# Patient Record
Sex: Female | Born: 1970 | Race: White | Hispanic: No | Marital: Married | Smoking: Current every day smoker
Health system: Southern US, Community
[De-identification: ages and names within clinical notes are randomized; demographics above are authoritative.]

## PROBLEM LIST (undated history)

## (undated) DIAGNOSIS — J45909 Unspecified asthma, uncomplicated: Secondary | ICD-10-CM

## (undated) DIAGNOSIS — J449 Chronic obstructive pulmonary disease, unspecified: Secondary | ICD-10-CM

## (undated) HISTORY — PX: COLONOSCOPY: SHX174

## (undated) HISTORY — PX: CHOLECYSTECTOMY: SHX55

## (undated) HISTORY — DX: Chronic obstructive pulmonary disease, unspecified: J44.9

## (undated) HISTORY — PX: TUBAL LIGATION: SHX77

---

## 2008-10-09 ENCOUNTER — Emergency Department (HOSPITAL_BASED_OUTPATIENT_CLINIC_OR_DEPARTMENT_OTHER): Admission: EM | Admit: 2008-10-09 | Discharge: 2008-10-09 | Payer: Self-pay | Admitting: Emergency Medicine

## 2008-10-09 ENCOUNTER — Encounter: Payer: Self-pay | Admitting: Family Medicine

## 2008-10-09 ENCOUNTER — Ambulatory Visit: Payer: Self-pay | Admitting: Diagnostic Radiology

## 2008-10-14 ENCOUNTER — Ambulatory Visit: Payer: Self-pay | Admitting: Family Medicine

## 2008-10-14 DIAGNOSIS — M79609 Pain in unspecified limb: Secondary | ICD-10-CM | POA: Insufficient documentation

## 2008-10-14 DIAGNOSIS — D759 Disease of blood and blood-forming organs, unspecified: Secondary | ICD-10-CM | POA: Insufficient documentation

## 2008-10-14 DIAGNOSIS — H9209 Otalgia, unspecified ear: Secondary | ICD-10-CM | POA: Insufficient documentation

## 2008-10-15 ENCOUNTER — Encounter: Payer: Self-pay | Admitting: Family Medicine

## 2008-10-15 LAB — CONVERTED CEMR LAB
BUN: 9 mg/dL (ref 6–23)
CO2: 25 meq/L (ref 19–32)
Creatinine, Ser: 0.85 mg/dL (ref 0.40–1.20)
Glucose, Bld: 96 mg/dL (ref 70–99)
TSH: 1.447 microintl units/mL (ref 0.350–4.50)
Total Bilirubin: 0.6 mg/dL (ref 0.3–1.2)
Vit D, 1,25-Dihydroxy: 24 — ABNORMAL LOW (ref 30–89)
Vitamin B-12: 505 pg/mL (ref 211–911)

## 2008-10-16 LAB — CONVERTED CEMR LAB: Iron: 130 ug/dL (ref 42–145)

## 2008-10-18 ENCOUNTER — Ambulatory Visit: Payer: Self-pay | Admitting: Family Medicine

## 2008-10-18 ENCOUNTER — Other Ambulatory Visit: Admission: RE | Admit: 2008-10-18 | Discharge: 2008-10-18 | Payer: Self-pay | Admitting: Family Medicine

## 2008-10-18 ENCOUNTER — Encounter: Payer: Self-pay | Admitting: Family Medicine

## 2008-10-18 DIAGNOSIS — K5901 Slow transit constipation: Secondary | ICD-10-CM | POA: Insufficient documentation

## 2008-10-18 DIAGNOSIS — K644 Residual hemorrhoidal skin tags: Secondary | ICD-10-CM | POA: Insufficient documentation

## 2008-10-21 LAB — CONVERTED CEMR LAB
Cholesterol: 174 mg/dL (ref 0–200)
Eosinophils Absolute: 0.2 10*3/uL (ref 0.0–0.7)
Eosinophils Relative: 2 % (ref 0–5)
HCT: 41.2 % (ref 36.0–46.0)
Lymphs Abs: 2.7 10*3/uL (ref 0.7–4.0)
MCV: 93.2 fL (ref 78.0–100.0)
Platelets: 556 10*3/uL — ABNORMAL HIGH (ref 150–400)
RDW: 13 % (ref 11.5–15.5)
VLDL: 12 mg/dL (ref 0–40)
WBC: 8.5 10*3/uL (ref 4.0–10.5)

## 2008-10-24 ENCOUNTER — Telehealth: Payer: Self-pay | Admitting: Family Medicine

## 2008-10-29 ENCOUNTER — Ambulatory Visit: Payer: Self-pay | Admitting: Oncology

## 2008-11-11 ENCOUNTER — Encounter: Payer: Self-pay | Admitting: Family Medicine

## 2008-11-11 ENCOUNTER — Telehealth (INDEPENDENT_AMBULATORY_CARE_PROVIDER_SITE_OTHER): Payer: Self-pay | Admitting: *Deleted

## 2008-11-11 LAB — CBC WITH DIFFERENTIAL/PLATELET
BASO%: 0.3 % (ref 0.0–2.0)
EOS%: 2.4 % (ref 0.0–7.0)
HCT: 41.7 % (ref 34.8–46.6)
LYMPH%: 25.8 % (ref 14.0–48.0)
MCH: 31.7 pg (ref 26.0–34.0)
MCHC: 33.6 g/dL (ref 32.0–36.0)
MONO#: 0.5 10*3/uL (ref 0.1–0.9)
NEUT%: 66.3 % (ref 39.6–76.8)
RBC: 4.42 10*6/uL (ref 3.70–5.32)
WBC: 9.4 10*3/uL (ref 3.9–10.0)
lymph#: 2.4 10*3/uL (ref 0.9–3.3)

## 2008-11-11 LAB — COMPREHENSIVE METABOLIC PANEL
Albumin: 4.3 g/dL (ref 3.5–5.2)
BUN: 7 mg/dL (ref 6–23)
CO2: 32 mEq/L (ref 19–32)
Glucose, Bld: 120 mg/dL — ABNORMAL HIGH (ref 70–99)
Potassium: 4 mEq/L (ref 3.5–5.3)
Sodium: 141 mEq/L (ref 135–145)
Total Protein: 7 g/dL (ref 6.0–8.3)

## 2008-11-11 LAB — MORPHOLOGY

## 2008-11-11 LAB — URIC ACID: Uric Acid, Serum: 4 mg/dL (ref 2.4–7.0)

## 2008-11-11 LAB — ERYTHROCYTE SEDIMENTATION RATE: Sed Rate: 6 mm/hr (ref 0–30)

## 2008-11-12 LAB — ANA: Anti Nuclear Antibody(ANA): NEGATIVE

## 2008-11-14 LAB — CK: Total CK: 73 U/L (ref 7–177)

## 2008-11-26 ENCOUNTER — Encounter: Payer: Self-pay | Admitting: Family Medicine

## 2008-12-09 ENCOUNTER — Encounter: Payer: Self-pay | Admitting: Family Medicine

## 2008-12-09 LAB — CBC WITH DIFFERENTIAL/PLATELET
Basophils Absolute: 0 10*3/uL (ref 0.0–0.1)
EOS%: 2.4 % (ref 0.0–7.0)
Eosinophils Absolute: 0.3 10*3/uL (ref 0.0–0.5)
HCT: 42.1 % (ref 34.8–46.6)
HGB: 14 g/dL (ref 11.6–15.9)
MCH: 31.5 pg (ref 25.1–34.0)
MONO#: 0.5 10*3/uL (ref 0.1–0.9)
NEUT#: 8 10*3/uL — ABNORMAL HIGH (ref 1.5–6.5)
NEUT%: 70.9 % (ref 38.4–76.8)
RDW: 13.4 % (ref 11.2–14.5)
lymph#: 2.5 10*3/uL (ref 0.9–3.3)

## 2008-12-09 LAB — COMPREHENSIVE METABOLIC PANEL
Albumin: 4.2 g/dL (ref 3.5–5.2)
Alkaline Phosphatase: 65 U/L (ref 39–117)
BUN: 12 mg/dL (ref 6–23)
CO2: 27 mEq/L (ref 19–32)
Calcium: 9.1 mg/dL (ref 8.4–10.5)
Chloride: 105 mEq/L (ref 96–112)
Glucose, Bld: 94 mg/dL (ref 70–99)
Potassium: 4.2 mEq/L (ref 3.5–5.3)
Sodium: 139 mEq/L (ref 135–145)
Total Protein: 7 g/dL (ref 6.0–8.3)

## 2008-12-11 LAB — JAK2 GENOTYPR: JAK2 GenotypR: NOT DETECTED

## 2009-02-13 ENCOUNTER — Ambulatory Visit: Payer: Self-pay | Admitting: Oncology

## 2009-02-17 ENCOUNTER — Encounter: Payer: Self-pay | Admitting: Family Medicine

## 2009-02-17 LAB — CBC WITH DIFFERENTIAL/PLATELET
BASO%: 0.5 % (ref 0.0–2.0)
Eosinophils Absolute: 0.4 10*3/uL (ref 0.0–0.5)
HCT: 39 % (ref 34.8–46.6)
LYMPH%: 31.8 % (ref 14.0–49.7)
MONO#: 0.4 10*3/uL (ref 0.1–0.9)
NEUT#: 4.8 10*3/uL (ref 1.5–6.5)
Platelets: 425 10*3/uL — ABNORMAL HIGH (ref 145–400)
RBC: 4.16 10*6/uL (ref 3.70–5.45)
WBC: 8.4 10*3/uL (ref 3.9–10.3)
lymph#: 2.7 10*3/uL (ref 0.9–3.3)

## 2009-02-17 LAB — ERYTHROCYTE SEDIMENTATION RATE: Sed Rate: 7 mm/hr (ref 0–30)

## 2009-04-15 ENCOUNTER — Ambulatory Visit: Payer: Self-pay | Admitting: Oncology

## 2009-06-24 ENCOUNTER — Ambulatory Visit: Payer: Self-pay | Admitting: Oncology

## 2009-09-24 ENCOUNTER — Emergency Department (HOSPITAL_COMMUNITY): Admission: EM | Admit: 2009-09-24 | Discharge: 2009-09-25 | Payer: Self-pay | Admitting: Emergency Medicine

## 2011-01-12 LAB — DIFFERENTIAL
Basophils Absolute: 0.2 10*3/uL — ABNORMAL HIGH (ref 0.0–0.1)
Lymphocytes Relative: 25 % (ref 12–46)
Monocytes Absolute: 0.7 10*3/uL (ref 0.1–1.0)
Monocytes Relative: 7 % (ref 3–12)
Neutro Abs: 6.5 10*3/uL (ref 1.7–7.7)
Neutrophils Relative %: 64 % (ref 43–77)

## 2011-01-12 LAB — URINALYSIS, ROUTINE W REFLEX MICROSCOPIC
Hgb urine dipstick: NEGATIVE
Nitrite: NEGATIVE
Specific Gravity, Urine: 1.023 (ref 1.005–1.030)
Urobilinogen, UA: 1 mg/dL (ref 0.0–1.0)

## 2011-01-12 LAB — CBC
HCT: 39.7 % (ref 36.0–46.0)
MCHC: 34.1 g/dL (ref 30.0–36.0)
MCV: 95.3 fL (ref 78.0–100.0)
Platelets: 497 10*3/uL — ABNORMAL HIGH (ref 150–400)
RDW: 13.1 % (ref 11.5–15.5)

## 2011-01-12 LAB — URINE CULTURE

## 2011-01-12 LAB — COMPREHENSIVE METABOLIC PANEL
Albumin: 3.9 g/dL (ref 3.5–5.2)
BUN: 9 mg/dL (ref 6–23)
Creatinine, Ser: 0.63 mg/dL (ref 0.4–1.2)
Total Protein: 6.6 g/dL (ref 6.0–8.3)

## 2011-01-12 LAB — PREGNANCY, URINE: Preg Test, Ur: NEGATIVE

## 2011-07-16 LAB — D-DIMER, QUANTITATIVE: D-Dimer, Quant: <0.22 ug/mL-FEU (ref 0.00–0.48)

## 2011-07-16 LAB — DIFFERENTIAL
Basophils Absolute: 0 10*3/uL (ref 0.0–0.1)
Basophils Relative: 0 % (ref 0–1)
Lymphocytes Relative: 30 % (ref 12–46)
Monocytes Absolute: 0.4 10*3/uL (ref 0.1–1.0)
Neutro Abs: 5.8 10*3/uL (ref 1.7–7.7)
Neutrophils Relative %: 62 % (ref 43–77)

## 2011-07-16 LAB — CBC
Hemoglobin: 13.9 g/dL (ref 12.0–15.0)
Platelets: 536 10*3/uL — ABNORMAL HIGH (ref 150–400)
RDW: 12.5 % (ref 11.5–15.5)

## 2011-07-16 LAB — BASIC METABOLIC PANEL
Calcium: 9.5 mg/dL (ref 8.4–10.5)
Creatinine, Ser: 0.8 mg/dL (ref 0.4–1.2)
GFR calc non Af Amer: >60 mL/min (ref >60)
Glucose, Bld: 105 mg/dL — ABNORMAL HIGH (ref 70–99)
Sodium: 141 mEq/L (ref 135–145)

## 2014-10-26 ENCOUNTER — Encounter: Payer: Self-pay | Admitting: Emergency Medicine

## 2014-10-26 ENCOUNTER — Emergency Department
Admission: EM | Admit: 2014-10-26 | Discharge: 2014-10-26 | Disposition: A | Discharge status: Home / Self Care | Payer: BLUE CROSS/BLUE SHIELD | Source: Home / Self Care | Attending: Family Medicine | Admitting: Family Medicine

## 2014-10-26 DIAGNOSIS — R112 Nausea with vomiting, unspecified: Secondary | ICD-10-CM

## 2014-10-26 DIAGNOSIS — R51 Headache: Secondary | ICD-10-CM

## 2014-10-26 DIAGNOSIS — R5382 Chronic fatigue, unspecified: Secondary | ICD-10-CM

## 2014-10-26 DIAGNOSIS — L989 Disorder of the skin and subcutaneous tissue, unspecified: Secondary | ICD-10-CM

## 2014-10-26 DIAGNOSIS — R519 Headache, unspecified: Secondary | ICD-10-CM

## 2014-10-26 DIAGNOSIS — R1013 Epigastric pain: Secondary | ICD-10-CM

## 2014-10-26 HISTORY — DX: Unspecified asthma, uncomplicated: J45.909

## 2014-10-26 LAB — POCT CBC W AUTO DIFF (K'VILLE URGENT CARE)

## 2014-10-26 LAB — POCT URINALYSIS DIP (MANUAL ENTRY)
BILIRUBIN UA: NEGATIVE
Glucose, UA: NEGATIVE
Ketones, POC UA: NEGATIVE
Nitrite, UA: NEGATIVE
PROTEIN UA: NEGATIVE
SPEC GRAV UA: 1.015 (ref 1.005–1.03)
UROBILINOGEN UA: 0.2 (ref 0–1)
pH, UA: 7 (ref 5–8)

## 2014-10-26 LAB — CBC
HCT: 42.4 % (ref 36.0–46.0)
HEMOGLOBIN: 14 g/dL (ref 12.0–15.0)
MCH: 31.7 pg (ref 26.0–34.0)
MCHC: 33 g/dL (ref 30.0–36.0)
MCV: 95.9 fL (ref 78.0–100.0)
MPV: 8.7 fL (ref 8.6–12.4)
PLATELETS: 558 10*3/uL — AB (ref 150–400)
RBC: 4.42 MIL/uL (ref 3.87–5.11)
RDW: 12.9 % (ref 11.5–15.5)
WBC: 8.5 10*3/uL (ref 4.0–10.5)

## 2014-10-26 LAB — COMPLETE METABOLIC PANEL WITH GFR
ALBUMIN: 4.3 g/dL (ref 3.5–5.2)
ALK PHOS: 71 U/L (ref 39–117)
ALT: 12 U/L (ref 0–35)
AST: 14 U/L (ref 0–37)
BILIRUBIN TOTAL: 0.5 mg/dL (ref 0.2–1.2)
BUN: 10 mg/dL (ref 6–23)
CHLORIDE: 103 meq/L (ref 96–112)
CO2: 26 meq/L (ref 19–32)
Calcium: 9.7 mg/dL (ref 8.4–10.5)
Creat: 0.77 mg/dL (ref 0.50–1.10)
GFR, Est African American: >89 mL/min
GFR, Est Non African American: >89 mL/min
GLUCOSE: 95 mg/dL (ref 70–99)
POTASSIUM: 4.6 meq/L (ref 3.5–5.3)
Sodium: 138 mEq/L (ref 135–145)
Total Protein: 7 g/dL (ref 6.0–8.3)

## 2014-10-26 LAB — POCT URINALYSIS DIPSTICK
Bilirubin, UA: NEGATIVE
Glucose, UA: NEGATIVE
KETONES UA: NEGATIVE
Nitrite, UA: NEGATIVE
PH UA: 7 (ref 5–8)
PROTEIN UA: NEGATIVE
Spec Grav, UA: 1.015 (ref 1.005–1.03)
Urobilinogen, UA: 0.2 (ref 0–1)

## 2014-10-26 LAB — SEDIMENTATION RATE: SED RATE: 9 mm/h (ref 0–22)

## 2014-10-26 LAB — C-REACTIVE PROTEIN

## 2014-10-26 LAB — CK: CK TOTAL: 96 U/L (ref 7–177)

## 2014-10-26 LAB — TSH: TSH: 1.087 u[IU]/mL (ref 0.350–4.500)

## 2014-10-26 MED ORDER — KETOROLAC TROMETHAMINE 60 MG/2ML IM SOLN
60.0000 mg | Freq: Once | INTRAMUSCULAR | Status: AC
  Administered 2014-10-26: 60 mg via INTRAMUSCULAR

## 2014-10-26 MED ORDER — ONDANSETRON HCL 4 MG/2ML IJ SOLN
4.0000 mg | Freq: Once | INTRAMUSCULAR | Status: AC
  Administered 2014-10-26: 4 mg via INTRAMUSCULAR

## 2014-10-26 MED ORDER — ONDANSETRON 4 MG PO TBDP: 4.0000 mg | ORAL_TABLET | Freq: Three times a day (TID) | ORAL | Status: DC | PRN

## 2014-10-26 NOTE — ED Notes (Signed)
Reports numerous symptoms over past months and has new patient appt.with PCP/[facility]. States 5 days ago began having nausea and vomiting, weakness and cramping of muscles.

## 2014-10-26 NOTE — ED Provider Notes (Signed)
CSN: [id number]     Arrival date & time 10/26/14  1219 History   First MD Initiated Contact with Patient 10/26/14 1253     Chief Complaint  Patient presents with  . Fatigue     HPI Comments: Patient presents with several complaints: 1)  She complains of five month history of recurring "sores" on her scalp that resolve and leave a "scab."  At present she has a small typical lesion on her right temporal area. 2)  She complains of persistent fatigue.  About 5 days ago she developed nausea/vomiting, weakness, and muscle cramps.  She has had "hot flashes" but no fever.  She complains of abdominal pain and occasional constipation but no recent changes in bowel movements.  She reports recent weight loss (147 pounds to 141 pounds) 3)  She complains of several year history of occasionally recurring brief "contractures" of her right hand for which she has undergone neurologic evaluation, including negative CT scan head one year ago, but no definite diagnosis.  About one month ago she began developing similar brief contractures in her right foot.  In the past (two years ago) she had a slightly elevated Rheumatoid Factor.   Past history of cervical cancer age 28.  Last pap smear 6 months ago negative.   The history is provided by the patient and the spouse.    Past Medical History  Diagnosis Date  . Asthma    Past Surgical History  Procedure Laterality Date  . Cholecystectomy    . Tubal ligation Bilateral    Family History  Problem Relation Age of Onset  . Diabetes Mother   . Stroke Mother   . Hypertension Mother   . Diabetes Sister   . Hypertension Sister   . Diabetes Brother   . Hypertension Brother    History  Substance Use Topics  . Smoking status: Current Every Day Smoker  . Smokeless tobacco: Not on file  . Alcohol Use: No   OB History    No data available     Review of Systems  Constitutional: Positive for activity change, fatigue and unexpected weight change. Negative for  fever, chills and diaphoresis.  HENT: Positive for ear pain and voice change. Negative for mouth sores, rhinorrhea, sinus pressure and trouble swallowing.        Recurring scalp lesions  Eyes: Negative.   Respiratory: Positive for shortness of breath. Negative for cough and wheezing.   Cardiovascular: Negative.   Gastrointestinal: Positive for nausea, vomiting, abdominal pain and constipation. Negative for diarrhea and blood in stool.  Endocrine: Negative for polydipsia and polyphagia.  Genitourinary: Negative.   Musculoskeletal: Positive for myalgias and arthralgias.  Skin: Negative for rash.  Neurological: Positive for headaches.       Occasional right hand and foot "contractures."    Allergies  Review of patient's allergies indicates no known allergies.  Home Medications   Prior to Admission medications   Medication Sig Start Date End Date Taking? Authorizing Provider  albuterol (PROVENTIL HFA;VENTOLIN HFA) 108 (90 BASE) MCG/ACT inhaler Inhale into the lungs every 6 (six) hours as needed for wheezing or shortness of breath.   Yes Historical Provider, MD  ondansetron (ZOFRAN ODT) 4 MG disintegrating tablet Take 1 tablet (4 mg total) by mouth every 8 (eight) hours as needed for nausea or vomiting. Dissolve under tongue 10/26/14   Kandra Nicolas, MD   BP 138/80 mmHg  Pulse 95  Temp(Src) 97.6 F (36.4 C) (Oral)  Resp 16  Ht 5'  2" (1.575 m)  Wt 140 lb (63.504 kg)  BMI 25.60 kg/m2  SpO2 100% Physical Exam  Constitutional: She is oriented to person, place, and time. She appears well-developed and well-nourished. No distress.  HENT:  Head: Normocephalic.    Right Ear: Tympanic membrane and external ear normal.  Left Ear: Tympanic membrane and external ear normal.  Nose: Nose normal.  Mouth/Throat: Oropharynx is clear and moist.  Right temporal area has an approximately 10mm diameter benign appearing slightly raised lesion with brownish pigmentation as noted on diagram.      Eyes: Conjunctivae and EOM are normal. Pupils are equal, round, and reactive to light.  Neck: Neck supple. No thyromegaly present.  Note tenderness to palpation over thyroid.  Cardiovascular: Normal heart sounds.   Pulmonary/Chest: Breath sounds normal.  Abdominal: She exhibits no mass. There is tenderness. There is guarding. There is no rebound.  There is tenderness over epigastric and peri-umbilical area.  Musculoskeletal: She exhibits no edema.  Lymphadenopathy:    She has no cervical adenopathy.  Neurological: She is alert and oriented to person, place, and time. She has normal reflexes. No cranial nerve deficit.  Skin: Skin is warm and dry.  Nursing note and vitals reviewed.   ED Course  Procedures  None     Labs Reviewed  POCT CBC W AUTO DIFF ([facility]):  WBC 8.9; LY 40.5; MO 5.6; GR 53.9; Hgb 14.0; Platelets 498   COMPLETE METABOLIC PANEL WITH GFR  SEDIMENTATION RATE  TSH  CK  ANA  C-REACTIVE PROTEIN  CBC  POCT URINALYSIS DIP (MANUAL ENTRY):  BLO moderate; LEU small; otherwise negative            MDM   1. Nausea and vomiting, vomiting of unspecified type   2. Chronic fatigue ?etiology; note thrombocytosis   3. Epigastric pain   4. Scalp lesion   5. Headache, unspecified headache type    Screening lab tests:  CBC to lab; CMP; Sed rate; C-reactive protein; TSH; CK; ANA  Toradol 60mg  IM for headache.  Zofran 4mg  IM for nausea.  Rx for Zofran ODT 4mg . Followup with Family Doctor as scheduled in 3 days.  Needs further evaluation of thrombocytosis. Recommend biopsy of scalp lesion    Kandra Nicolas, MD 10/28/14 [phone]

## 2014-10-26 NOTE — Discharge Instructions (Signed)
May take Ibuprofen 200mg , 4 tabs every 8 hours with food for headache. Begin clear liquids for 12 hours, then gradually advance diet.

## 2014-10-28 ENCOUNTER — Encounter: Payer: Self-pay | Admitting: Physician Assistant

## 2014-10-28 ENCOUNTER — Ambulatory Visit (INDEPENDENT_AMBULATORY_CARE_PROVIDER_SITE_OTHER): Payer: BLUE CROSS/BLUE SHIELD | Admitting: Physician Assistant

## 2014-10-28 VITALS — BP 152/84 | HR 75 | Temp 98.2°F | Ht 61.0 in | Wt 139.0 lb

## 2014-10-28 DIAGNOSIS — H539 Unspecified visual disturbance: Secondary | ICD-10-CM

## 2014-10-28 DIAGNOSIS — R1011 Right upper quadrant pain: Secondary | ICD-10-CM

## 2014-10-28 DIAGNOSIS — R531 Weakness: Secondary | ICD-10-CM

## 2014-10-28 DIAGNOSIS — R319 Hematuria, unspecified: Secondary | ICD-10-CM

## 2014-10-28 DIAGNOSIS — R634 Abnormal weight loss: Secondary | ICD-10-CM | POA: Diagnosis not present

## 2014-10-28 DIAGNOSIS — L989 Disorder of the skin and subcutaneous tissue, unspecified: Secondary | ICD-10-CM

## 2014-10-28 DIAGNOSIS — R232 Flushing: Secondary | ICD-10-CM

## 2014-10-28 DIAGNOSIS — R252 Cramp and spasm: Secondary | ICD-10-CM

## 2014-10-28 DIAGNOSIS — L659 Nonscarring hair loss, unspecified: Secondary | ICD-10-CM

## 2014-10-28 DIAGNOSIS — N951 Menopausal and female climacteric states: Secondary | ICD-10-CM

## 2014-10-28 DIAGNOSIS — F411 Generalized anxiety disorder: Secondary | ICD-10-CM

## 2014-10-28 DIAGNOSIS — R1031 Right lower quadrant pain: Secondary | ICD-10-CM

## 2014-10-28 LAB — POCT URINALYSIS DIPSTICK
BILIRUBIN UA: NEGATIVE
GLUCOSE UA: NEGATIVE
NITRITE UA: NEGATIVE
PROTEIN UA: NEGATIVE
SPEC GRAV UA: 1.02
Urobilinogen, UA: 1
pH, UA: 5.5

## 2014-10-28 LAB — ANA: Anti Nuclear Antibody(ANA): NEGATIVE

## 2014-10-28 MED ORDER — VENLAFAXINE HCL ER 37.5 MG PO CP24: 37.5000 mg | ORAL_CAPSULE | Freq: Every day | ORAL | Status: DC

## 2014-10-28 MED ORDER — PANTOPRAZOLE SODIUM 40 MG PO TBEC: 40.0000 mg | DELAYED_RELEASE_TABLET | Freq: Every day | ORAL | Status: DC

## 2014-10-28 MED ORDER — CYCLOBENZAPRINE HCL 10 MG PO TABS: 10.0000 mg | ORAL_TABLET | Freq: Three times a day (TID) | ORAL | Status: DC | PRN

## 2014-10-29 LAB — VITAMIN B12: VITAMIN B 12: 457 pg/mL (ref 211–911)

## 2014-10-29 LAB — RHEUMATOID FACTOR: Rhuematoid fact SerPl-aCnc: <10 IU/mL (ref <=14)

## 2014-10-29 LAB — VITAMIN D 25 HYDROXY (VIT D DEFICIENCY, FRACTURES): VIT D 25 HYDROXY: 16 ng/mL — AB (ref 30–100)

## 2014-10-30 ENCOUNTER — Telehealth: Payer: Self-pay | Admitting: *Deleted

## 2014-10-30 DIAGNOSIS — R1011 Right upper quadrant pain: Secondary | ICD-10-CM | POA: Insufficient documentation

## 2014-10-30 DIAGNOSIS — R252 Cramp and spasm: Secondary | ICD-10-CM | POA: Insufficient documentation

## 2014-10-30 DIAGNOSIS — R634 Abnormal weight loss: Secondary | ICD-10-CM | POA: Insufficient documentation

## 2014-10-30 DIAGNOSIS — R319 Hematuria, unspecified: Secondary | ICD-10-CM | POA: Insufficient documentation

## 2014-10-30 DIAGNOSIS — R1031 Right lower quadrant pain: Secondary | ICD-10-CM | POA: Insufficient documentation

## 2014-10-30 DIAGNOSIS — R232 Flushing: Secondary | ICD-10-CM | POA: Insufficient documentation

## 2014-10-30 DIAGNOSIS — F411 Generalized anxiety disorder: Secondary | ICD-10-CM | POA: Insufficient documentation

## 2014-10-30 DIAGNOSIS — R531 Weakness: Secondary | ICD-10-CM | POA: Insufficient documentation

## 2014-10-30 DIAGNOSIS — L989 Disorder of the skin and subcutaneous tissue, unspecified: Secondary | ICD-10-CM | POA: Insufficient documentation

## 2014-10-30 DIAGNOSIS — L659 Nonscarring hair loss, unspecified: Secondary | ICD-10-CM | POA: Insufficient documentation

## 2014-10-30 DIAGNOSIS — H539 Unspecified visual disturbance: Secondary | ICD-10-CM | POA: Insufficient documentation

## 2014-10-30 LAB — METHYLMALONIC ACID, SERUM: Methylmalonic Acid, Quant: 145 nmol/L (ref 87–318)

## 2014-10-30 LAB — CYCLIC CITRUL PEPTIDE ANTIBODY, IGG: Cyclic Citrullin Peptide Ab: <2 U/mL (ref 0.0–5.0)

## 2014-10-30 LAB — URINE CULTURE
COLONY COUNT: NO GROWTH
Organism ID, Bacteria: NO GROWTH

## 2014-10-30 NOTE — Telephone Encounter (Signed)
CT approval rec'd from [facility] - [id number] expires 11/28/14. Radiology called.

## 2014-10-30 NOTE — Progress Notes (Addendum)
Subjective:    Patient ID: Danielle Marsh, female    DOB: 1971-04-21, 44 y.o.   MRN: [id number]  HPI Pt is a 44 yo female who presents to the clinic to establish care.   .. Active Ambulatory Problems    Diagnosis Date Noted  . THROMBOCYTOSIS 10/14/2008  . EAR PAIN, RIGHT 10/14/2008  . EXTERNAL HEMORRHOIDS 10/18/2008  . CONSTIPATION 10/18/2008  . LEG PAIN, BILATERAL 10/14/2008  . Weakness 10/30/2014  . Loss of weight 10/30/2014  . Hair loss 10/30/2014  . Vision changes 10/30/2014  . Skin lesion of scalp 10/30/2014  . Hot flashes 10/30/2014  . Hematuria 10/30/2014  . RUQ pain 10/30/2014  . RLQ abdominal pain 10/30/2014   Resolved Ambulatory Problems    Diagnosis Date Noted  . No Resolved Ambulatory Problems   Past Medical History  Diagnosis Date  . Asthma    .Marland Kitchen Family History  Problem Relation Age of Onset  . Diabetes Mother   . Stroke Mother   . Hypertension Mother   . Diabetes Sister   . Hypertension Sister   . Diabetes Brother   . Hypertension Brother    .Marland Kitchen History   Social History  . Marital Status: Married    Spouse Name: N/A    Number of Children: N/A  . Years of Education: N/A   Occupational History  . Not on file.   Social History Main Topics  . Smoking status: Current Every Day Smoker  . Smokeless tobacco: Not on file  . Alcohol Use: No  . Drug Use: No  . Sexual Activity: Not on file   Other Topics Concern  . Not on file   Social History Narrative   Patient today has multiple concerns. She admits that front she does not like healthcare providers. She often does not like the procedures to reach a diagnosis. She's been seen by neurologist in the past that suspected MS but she would not go through with the spinal tap. MRI never showed any lesions that could bring a diagnosis of MS. She comes in today with a lot of lower extremity weakness. She has used her chronic pain but she is not used to not feel like she can move her legs. She has seen  Dr. Maxie Better, an orthopedist and he felt like her symptoms worsen efficacy of her MS but Dr. Berdine Addison did not come up with a diagnosis. She has had some unintentional weight loss from 147 11/11/1937 in the past month. She has had 6 months of cyclical sores appearing in her scalp that are tender. She denies any drainage or vesicles. They resolve on their own and then come back. She's having a lot of muscle cramps and contractures. There is no rhyme or reason to these symptoms. They can occur at any time. She's become much more nauseated over the past week or so. She's developed some abdominal pain generalized. She denies any blood in her stool. She denies any food making her abdominal pain worse. She has had some vision blurriness which she plans on making an eye doctor appointment. She's had some hair loss.  Patient was seen in urgent care over the weekend and blood work was done. We will also get this blood work. No improvement over the weekend.     Review of Systems  All other systems reviewed and are negative.      Objective:   Physical Exam  Constitutional: She is oriented to person, place, and time.  HENT:  Head: Normocephalic  and atraumatic.    Right Ear: External ear normal.  Left Ear: External ear normal.  Eyes: Conjunctivae are normal.  Neck: Normal range of motion. Neck supple. No thyromegaly present.  Cardiovascular: Normal rate and normal heart sounds.   Pulmonary/Chest: Effort normal and breath sounds normal.  Abdominal: Soft. Bowel sounds are normal.  With palpation over her right upper quadrant and right lower quadrant there was intense pain. Patient started crying and screaming when I touch these areas to even light palpation. No rebound  Musculoskeletal:  No extremity weakness noted on exam today 5 out of 5.  Lymphadenopathy:    She has no cervical adenopathy.  Neurological: She is alert and oriented to person, place, and time.  Skin: Skin is dry.  Psychiatric: She has a  normal mood and affect. Her behavior is normal.          Assessment & Plan:  Weakness/loss of hair/loss of weight/vision changes/hot flashes/muscle cramps-unsure of what to make of these symptoms. Certainly think she needs an evaluation by rheumatologist/neurologist We'll start with some baseline blood work in the office. An move forward from there. For her muscle cramps I did give some Flexeril at bedtime. Discussed sedation warning. For patient's hot flashes and potential increased anxiety started Effexor. Perhaps this would also help with some of her ongoing pain. Follow-up in 4-6 weeks.  Right upper quadrant pain/nausea-patient was very tender to palpation on physical exam today. I did start Protonix 40 mg daily. Also will order a CT scan of the abdomen to evaluate. Would like to also test blood for H. pylori today. If pain worsening or not improving or develops blood in stool please follow-up.   Hematuria- will culture. No symptoms. Urine dipstick positive for blood and leuks. Will follow up as needed.

## 2014-10-31 ENCOUNTER — Ambulatory Visit (INDEPENDENT_AMBULATORY_CARE_PROVIDER_SITE_OTHER): Payer: BLUE CROSS/BLUE SHIELD

## 2014-10-31 DIAGNOSIS — R319 Hematuria, unspecified: Secondary | ICD-10-CM

## 2014-10-31 DIAGNOSIS — H539 Unspecified visual disturbance: Secondary | ICD-10-CM

## 2014-10-31 DIAGNOSIS — L989 Disorder of the skin and subcutaneous tissue, unspecified: Secondary | ICD-10-CM

## 2014-10-31 DIAGNOSIS — R232 Flushing: Secondary | ICD-10-CM

## 2014-10-31 DIAGNOSIS — L659 Nonscarring hair loss, unspecified: Secondary | ICD-10-CM

## 2014-10-31 DIAGNOSIS — Z9049 Acquired absence of other specified parts of digestive tract: Secondary | ICD-10-CM

## 2014-10-31 DIAGNOSIS — R1031 Right lower quadrant pain: Secondary | ICD-10-CM

## 2014-10-31 DIAGNOSIS — R531 Weakness: Secondary | ICD-10-CM

## 2014-10-31 DIAGNOSIS — R1011 Right upper quadrant pain: Secondary | ICD-10-CM

## 2014-10-31 DIAGNOSIS — R634 Abnormal weight loss: Secondary | ICD-10-CM

## 2014-10-31 LAB — H. PYLORI ANTIBODY, IGG: H Pylori IgG: 0.41 {ISR}

## 2014-10-31 MED ORDER — IOHEXOL 300 MG/ML  SOLN
100.0000 mL | Freq: Once | INTRAMUSCULAR | Status: AC | PRN
  Administered 2014-10-31: 100 mL via INTRAVENOUS

## 2016-11-24 DIAGNOSIS — N951 Menopausal and female climacteric states: Secondary | ICD-10-CM | POA: Diagnosis not present

## 2016-11-24 DIAGNOSIS — Z0000 Encounter for general adult medical examination without abnormal findings: Secondary | ICD-10-CM | POA: Diagnosis not present

## 2016-11-24 DIAGNOSIS — Z6829 Body mass index (BMI) 29.0-29.9, adult: Secondary | ICD-10-CM | POA: Diagnosis not present

## 2016-11-24 DIAGNOSIS — Z01411 Encounter for gynecological examination (general) (routine) with abnormal findings: Secondary | ICD-10-CM | POA: Diagnosis not present

## 2016-11-24 DIAGNOSIS — R319 Hematuria, unspecified: Secondary | ICD-10-CM | POA: Diagnosis not present

## 2016-11-24 DIAGNOSIS — Z124 Encounter for screening for malignant neoplasm of cervix: Secondary | ICD-10-CM | POA: Diagnosis not present

## 2016-11-24 DIAGNOSIS — R42 Dizziness and giddiness: Secondary | ICD-10-CM | POA: Diagnosis not present

## 2016-11-24 DIAGNOSIS — N950 Postmenopausal bleeding: Secondary | ICD-10-CM | POA: Diagnosis not present

## 2016-11-24 DIAGNOSIS — R6882 Decreased libido: Secondary | ICD-10-CM | POA: Diagnosis not present

## 2016-12-08 DIAGNOSIS — E559 Vitamin D deficiency, unspecified: Secondary | ICD-10-CM | POA: Diagnosis not present

## 2016-12-08 DIAGNOSIS — R899 Unspecified abnormal finding in specimens from other organs, systems and tissues: Secondary | ICD-10-CM | POA: Diagnosis not present

## 2016-12-08 DIAGNOSIS — N951 Menopausal and female climacteric states: Secondary | ICD-10-CM | POA: Diagnosis not present

## 2017-03-18 ENCOUNTER — Ambulatory Visit: Payer: Self-pay | Admitting: Family Medicine

## 2017-06-03 ENCOUNTER — Telehealth: Payer: Self-pay | Admitting: Family Medicine

## 2017-06-03 NOTE — Telephone Encounter (Signed)
Sure

## 2017-06-03 NOTE — Telephone Encounter (Signed)
Pt has not been seen by you since 2016 and was able to get an appointment with Evlyn Clines sooner,  is it okay with you that she becomes Charley's pt?

## 2017-06-08 ENCOUNTER — Ambulatory Visit: Payer: Self-pay | Admitting: Physician Assistant

## 2017-11-14 ENCOUNTER — Ambulatory Visit (INDEPENDENT_AMBULATORY_CARE_PROVIDER_SITE_OTHER): Payer: BLUE CROSS/BLUE SHIELD

## 2017-11-14 ENCOUNTER — Encounter: Payer: Self-pay | Admitting: Family Medicine

## 2017-11-14 ENCOUNTER — Ambulatory Visit: Payer: BLUE CROSS/BLUE SHIELD | Admitting: Family Medicine

## 2017-11-14 VITALS — BP 141/80 | HR 100 | Ht 61.0 in | Wt 156.0 lb

## 2017-11-14 DIAGNOSIS — W19XXXA Unspecified fall, initial encounter: Secondary | ICD-10-CM

## 2017-11-14 DIAGNOSIS — S199XXA Unspecified injury of neck, initial encounter: Secondary | ICD-10-CM | POA: Diagnosis not present

## 2017-11-14 DIAGNOSIS — S39012A Strain of muscle, fascia and tendon of lower back, initial encounter: Secondary | ICD-10-CM | POA: Diagnosis not present

## 2017-11-14 DIAGNOSIS — S161XXA Strain of muscle, fascia and tendon at neck level, initial encounter: Secondary | ICD-10-CM

## 2017-11-14 MED ORDER — CYCLOBENZAPRINE HCL 10 MG PO TABS
10.0000 mg | ORAL_TABLET | Freq: Every day | ORAL | 0 refills | Status: DC
Start: 2017-11-14 — End: 2018-12-27

## 2017-11-14 MED ORDER — NAPROXEN 500 MG PO TABS: 500.0000 mg | ORAL_TABLET | Freq: Two times a day (BID) | ORAL | 0 refills | Status: DC

## 2017-11-14 NOTE — Progress Notes (Signed)
Danielle Marsh is a 47 y.o. female who presents to [place] today for headache, back pain, and neck pain.  Patient strained back and hit head after climbing under desk at work on Friday. While climbing out, patient hit posterior aspect of head on under surface of desk. In addition, patient noticed significant neck and lower back pain after that point. She has continued to have a low grade headache that is responsive to ibuprofen. Denies dizziness, lightheadedness, balance issues, difficulty concentrating and nausea/vomiting. In terms of neck pain, patient feels like her neck is tight and achy. Has had shooting pain into right arm, but intermittent in nature and improving. Otherwise, patient complains of lower back pain, particularly on the right side. She states that sitting for prolonged periods of time and bending over make the pain worse. Heating pads and ibuprofen help the lower back pain somewhat. Patient experiencing intermittent sharp pain into mid thigh. Patients denies bowel or bladder incontinence and any sensory changes.    Past Medical History:  Diagnosis Date  . Asthma    Past Surgical History:  Procedure Laterality Date  . CHOLECYSTECTOMY    . TUBAL LIGATION Bilateral    Social History   Tobacco Use  . Smoking status: Current Every Day Smoker  . Smokeless tobacco: Never Used  Substance Use Topics  . Alcohol use: No     ROS:  As above   Medications: Current Outpatient Medications  Medication Sig Dispense Refill  . albuterol (PROVENTIL HFA;VENTOLIN HFA) 108 (90 BASE) MCG/ACT inhaler Inhale into the lungs every 6 (six) hours as needed for wheezing or shortness of breath.    . ondansetron (ZOFRAN ODT) 4 MG disintegrating tablet Take 1 tablet (4 mg total) by mouth every 8 (eight) hours as needed for nausea or vomiting. Dissolve under tongue 12 tablet 0  . pantoprazole (PROTONIX) 40 MG tablet Take 1 tablet (40 mg total) by  mouth daily. 30 tablet 2  . venlafaxine XR (EFFEXOR-XR) 37.5 MG 24 hr capsule Take 1 capsule (37.5 mg total) by mouth daily with breakfast. 30 capsule 1  . cyclobenzaprine (FLEXERIL) 10 MG tablet Take 1 tablet (10 mg total) by mouth at bedtime. 30 tablet 0  . naproxen (NAPROSYN) 500 MG tablet Take 1 tablet (500 mg total) by mouth 2 (two) times daily with a meal. 30 tablet 0   No current facility-administered medications for this visit.    No Known Allergies   Exam:  BP (!) 141/80   Pulse 100   Ht 5\' 1"  (1.549 m)   Wt 156 lb (70.8 kg)   BMI 29.48 kg/m  General: Well Developed, well nourished, and in no acute distress.  Neuro/Psych: Alert and oriented x3, extra-ocular muscles intact, able to move all 4 extremities, sensation grossly intact. Skin: Warm and dry, no rashes noted.  Respiratory: Not using accessory muscles, speaking in full sentences, trachea midline.  Cardiovascular: Pulses palpable, no extremity edema. Abdomen: Does not appear distended. MSK:  Cervical: Normal in appearance. Paraspinal and right periscapular muscle tenderness. Decreased active ROM on flexion and lateral flexion to right.  Strength 5+ in bilateral upper extremities. Reflexes 2+ in bilateral upper extremities.  Lumbosacral: Normal in appearance. Paraspinal muscle tenderness. No tenderness to palpation over midline lumbar spine. Strength 5/5 in lower extremities. Slump test mildly positive bilaterally.    No results found for this or any previous visit (from the past 48 hour(s)). No results found.    Assessment and Plan:  47 y.o. female with headache, neck pain, and back pain since Friday.  Headache: patient without symptoms of true concussion. She should continue NSAIDs for headache. Having no balance issues, cognitive defecit, dizziness, or nausea/vomiting.   Neck pain: Likely cervical muscle spasm right > left. Patient with decreased active ROM secondary to pain and muscular spasm. Reflexes  and strength intact. Will order cervical xray to rule out vertebral fracture or pathology.  Lumbosacral strain: patient's signs and symptoms most consistent with lumbosacral strain. Fracture or disc herniation are very unlikely given presentation. Patient will benefit from Naproxen, Flexeril, TINS unit, heating unit, and formal physical therapy.  Patient should return to follow up in 4 weeks.   Orders Placed This Encounter  Procedures  . DG Cervical Spine Complete    Standing Status:   Future    Standing Expiration Date:   01/13/2019    Order Specific Question:   Reason for Exam (SYMPTOM  OR DIAGNOSIS REQUIRED)    Answer:   eval pain cpsine after hitting head a few days ago    Order Specific Question:   Is patient pregnant?    Answer:   No    Order Specific Question:   Preferred imaging location?    Answer:   [facility]    Order Specific Question:   Radiology Contrast Protocol - do NOT remove file path    Answer:   [email or web address]  . Ambulatory referral to Physical Therapy    Referral Priority:   Routine    Referral Type:   Physical Medicine    Referral Reason:   Specialty Services Required    Requested Specialty:   Physical Therapy   Meds ordered this encounter  Medications  . naproxen (NAPROSYN) 500 MG tablet    Sig: Take 1 tablet (500 mg total) by mouth 2 (two) times daily with a meal.    Dispense:  30 tablet    Refill:  0  . cyclobenzaprine (FLEXERIL) 10 MG tablet    Sig: Take 1 tablet (10 mg total) by mouth at bedtime.    Dispense:  30 tablet    Refill:  0    Discussed warning signs or symptoms. Please see discharge instructions. Patient expresses understanding.

## 2017-11-14 NOTE — Patient Instructions (Signed)
Thank you for coming in today. Come back or go to the emergency room if you notice new weakness new numbness problems walking or bowel or bladder problems. Attend PT.  Use muscle relaxer as needed at night.  Use naproxen twice daily.  Use a heating pad and TENS unit.  TENS UNIT: This is helpful for muscle pain and spasm.   Search and Purchase a TENS 7000 2nd edition at  www.tenspros.com or [email or web address] It should be less than $30.     TENS unit instructions: Do not shower or bathe with the unit on Turn the unit off before removing electrodes or batteries If the electrodes lose stickiness add a drop of water to the electrodes after they are disconnected from the unit and place on plastic sheet. If you continued to have difficulty, call the TENS unit company to purchase more electrodes. Do not apply lotion on the skin area prior to use. Make sure the skin is clean and dry as this will help prolong the life of the electrodes. After use, always check skin for unusual red areas, rash or other skin difficulties. If there are any skin problems, does not apply electrodes to the same area. Never remove the electrodes from the unit by pulling the wires. Do not use the TENS unit or electrodes other than as directed. Do not change electrode placement without consultating your therapist or physician. Keep 2 fingers with between each electrode. Wear time ratio is 2:1, on to off times.    For example on for 30 minutes off for 15 minutes and then on for 30 minutes off for 15 minutes      Lumbosacral Strain Lumbosacral strain is an injury that causes pain in the lower back (lumbosacral spine). This injury usually occurs from overstretching the muscles or ligaments along your spine. A strain can affect one or more muscles or cord-like tissues that connect bones to other bones (ligaments). What are the causes? This condition may be caused by:  A hard, direct hit (blow) to the back.  Excessive  stretching of the lower back muscles. This may result from: ? A fall. ? Lifting something heavy. ? Repetitive movements such as bending or crouching.  What increases the risk? The following factors may increase your risk of getting this condition:  Participating in sports or activities that involve: ? A sudden twist of the back. ? Pushing or pulling motions.  Being overweight or obese.  Having poor strength and flexibility, especially tight hamstrings or weak muscles in the back or abdomen.  Having too much of a curve in the lower back.  Having a pelvis that is tilted forward.  What are the signs or symptoms? The main symptom of this condition is pain in the lower back, at the site of the strain. Pain may extend (radiate) down one or both legs. How is this diagnosed? This condition is diagnosed based on:  Your symptoms.  Your medical history.  A physical exam. ? Your health care provider may push on certain areas of your back to determine the source of your pain. ? You may be asked to bend forward, backward, and side to side to assess the severity of your pain and your range of motion.  Imaging tests, such as: ? X-rays. ? MRI.  How is this treated? Treatment for this condition may include:  Putting heat and cold on the affected area.  Medicines to help relieve pain and relax your muscles (muscle relaxants).  NSAIDs to help reduce  swelling and discomfort.  When your symptoms improve, it is important to gradually return to your normal routine as soon as possible to reduce pain, avoid stiffness, and avoid loss of muscle strength. Generally, symptoms should improve within 6 weeks of treatment. However, recovery time varies. Follow these instructions at home: Managing pain, stiffness, and swelling   If directed, put ice on the injured area during the first 24 hours after your strain. ? Put ice in a plastic bag. ? Place a towel between your skin and the bag. ? Leave  the ice on for 20 minutes, 2-3 times a day.  If directed, put heat on the affected area as often as told by your health care provider. Use the heat source that your health care provider recommends, such as a moist heat pack or a heating pad. ? Place a towel between your skin and the heat source. ? Leave the heat on for 20-30 minutes. ? Remove the heat if your skin turns bright red. This is especially important if you are unable to feel pain, heat, or cold. You may have a greater risk of getting burned. Activity  Rest and return to your normal activities as told by your health care provider. Ask your health care provider what activities are safe for you.  Avoid activities that take a lot of energy for as long as told by your health care provider. General instructions  Take over-the-counter and prescription medicines only as told by your health care provider.  Donot drive or use heavy machinery while taking prescription pain medicine.  Do not use any products that contain nicotine or tobacco, such as cigarettes and e-cigarettes. If you need help quitting, ask your health care provider.  Keep all follow-up visits as told by your health care provider. This is important. How is this prevented?  Use correct form when playing sports and lifting heavy objects.  Use good posture when sitting and standing.  Maintain a healthy weight.  Sleep on a mattress with medium firmness to support your back.  Be safe and responsible while being active to avoid falls.  Do at least 150 minutes of moderate-intensity exercise each week, such as brisk walking or water aerobics. Try a form of exercise that takes stress off your back, such as swimming or stationary cycling.  Maintain physical fitness, including: ? Strength. ? Flexibility. ? Cardiovascular fitness. ? Endurance. Contact a health care provider if:  Your back pain does not improve after 6 weeks of treatment.  Your symptoms get worse. Get  help right away if:  Your back pain is severe.  You cannot stand or walk.  You have difficulty controlling when you urinate or when you have a bowel movement.  You feel nauseous or you vomit.  Your feet get very cold.  You have numbness, tingling, weakness, or problems using your arms or legs.  You develop any of the following: ? Shortness of breath. ? Dizziness. ? Pain in your legs. ? Weakness in your buttocks or legs. ? Discoloration of the skin on your toes or legs. This information is not intended to replace advice given to you by your health care provider. Make sure you discuss any questions you have with your health care provider. Document Released: 07/07/2005 Document Revised: 04/16/2016 Document Reviewed: 02/29/2016 Elsevier Interactive Patient Education  2018 [employer].    Cervical Sprain A cervical sprain is a stretch or tear in one or more of the tough, cord-like tissues that connect bones (ligaments) in the  neck. Cervical sprains can range from mild to severe. Severe cervical sprains can cause the spinal bones (vertebrae) in the neck to be unstable. This can lead to spinal cord damage and can result in serious nervous system problems. The amount of time that it takes for a cervical sprain to get better depends on the cause and extent of the injury. Most cervical sprains heal in 4-6 weeks. What are the causes? Cervical sprains may be caused by an injury (trauma), such as from a motor vehicle accident, a fall, or sudden forward and backward whipping movement of the head and neck (whiplash injury). Mild cervical sprains may be caused by wear and tear over time, such as from poor posture, sitting in a chair that does not provide support, or looking up or down for long periods of time. What increases the risk? The following factors may make you more likely to develop this condition:  Participating in activities that have a high risk of trauma to the neck. These include  contact sports, auto racing, gymnastics, and diving.  Taking risks when driving or riding in a motor vehicle, such as speeding.  Having osteoarthritis of the spine.  Having poor strength and flexibility of the neck.  A previous neck injury.  Having poor posture.  Spending a lot of time in certain positions that put stress on the neck, such as sitting at a computer for long periods of time.  What are the signs or symptoms? Symptoms of this condition include:  Pain, soreness, stiffness, tenderness, swelling, or a burning sensation in the front, back, or sides of the neck.  Sudden tightening of neck muscles that you cannot control (muscle spasms).  Pain in the shoulders or upper back.  Limited ability to move the neck.  Headache.  Dizziness.  Nausea.  Vomiting.  Weakness, numbness, or tingling in a hand or an arm.  Symptoms may develop right away after injury, or they may develop over a few days. In some cases, symptoms may go away with treatment and return (recur) over time. How is this diagnosed? This condition may be diagnosed based on:  Your medical history.  Your symptoms.  Any recent injuries or known neck problems that you have, such as arthritis in the neck.  A physical exam.  Imaging tests, such as: ? X-rays. ? MRI. ? CT scan.  How is this treated? This condition is treated by resting and icing the injured area and doing physical therapy exercises. Depending on the severity of your condition, treatment may also include:  Keeping your neck in place (immobilized) for periods of time. This may be done using: ? A cervical collar. This supports your chin and the back of your head. ? A cervical traction device. This is a sling that holds up your head. This removes weight and pressure from your neck, and it may help to relieve pain.  Medicines that help to relieve pain and inflammation.  Medicines that help to relax your muscles (muscle  relaxants).  Surgery. This is rare.  Follow these instructions at home: If you have a cervical collar:  Wear it as told by your health care provider. Do not remove the collar unless instructed by your health care provider.  Ask your health care provider before you make any adjustments to your collar.  If you have long hair, keep it outside of the collar.  Ask your health care provider if you can remove the collar for cleaning and bathing. If you are allowed to  remove the collar for cleaning or bathing: ? Follow instructions from your health care provider about how to remove the collar safely. ? Clean the collar by wiping it with mild soap and water and drying it completely. ? If your collar has removable pads, remove them every 1-2 days and wash them by hand with soap and water. Let them air-dry completely before you put them back in the collar. ? Check your skin under the collar for irritation or sores. If you see any, tell your health care provider. Managing pain, stiffness, and swelling  If directed, use a cervical traction device as told by your health care provider.  If directed, apply heat to the affected area before you do your physical therapy or as often as told by your health care provider. Use the heat source that your health care provider recommends, such as a moist heat pack or a heating pad. ? Place a towel between your skin and the heat source. ? Leave the heat on for 20-30 minutes. ? Remove the heat if your skin turns bright red. This is especially important if you are unable to feel pain, heat, or cold. You may have a greater risk of getting burned.  If directed, put ice on the affected area: ? Put ice in a plastic bag. ? Place a towel between your skin and the bag. ? Leave the ice on for 20 minutes, 2-3 times a day. Activity  Do not drive while wearing a cervical collar. If you do not have a cervical collar, ask your health care provider if it is safe to drive while  your neck heals.  Do not drive or use heavy machinery while taking prescription pain medicine or muscle relaxants, unless your health care provider approves.  Do not lift anything that is heavier than 10 lb (4.5 kg) until your health care provider tells you that it is safe.  Rest as directed by your health care provider. Avoid positions and activities that make your symptoms worse. Ask your health care provider what activities are safe for you.  If physical therapy was prescribed, do exercises as told by your health care provider or physical therapist. General instructions  Take over-the-counter and prescription medicines only as told by your health care provider.  Do not use any products that contain nicotine or tobacco, such as cigarettes and e-cigarettes. These can delay healing. If you need help quitting, ask your health care provider.  Keep all follow-up visits as told by your health care provider or physical therapist. This is important. How is this prevented? To prevent a cervical sprain from happening again:  Use and maintain good posture. Make any needed adjustments to your workstation to help you use good posture.  Exercise regularly as directed by your health care provider or physical therapist.  Avoid risky activities that may cause a cervical sprain.  Contact a health care provider if:  You have symptoms that get worse or do not get better after 2 weeks of treatment.  You have pain that gets worse or does not get better with medicine.  You develop new, unexplained symptoms.  You have sores or irritated skin on your neck from wearing your cervical collar. Get help right away if:  You have severe pain.  You develop numbness, tingling, or weakness in any part of your body.  You cannot move a part of your body (you have paralysis).  You have neck pain along with: ? Severe dizziness. ? Headache. Summary  A  cervical sprain is a stretch or tear in one or more of  the tough, cord-like tissues that connect bones (ligaments) in the neck.  Cervical sprains may be caused by an injury (trauma), such as from a motor vehicle accident, a fall, or sudden forward and backward whipping movement of the head and neck (whiplash injury).  Symptoms may develop right away after injury, or they may develop over a few days.  This condition is treated by resting and icing the injured area and doing physical therapy exercises. This information is not intended to replace advice given to you by your health care provider. Make sure you discuss any questions you have with your health care provider. Document Released: 07/25/2007 Document Revised: 05/26/2016 Document Reviewed: 05/26/2016 Elsevier Interactive Patient Education  [employer].

## 2017-11-23 ENCOUNTER — Ambulatory Visit: Payer: BLUE CROSS/BLUE SHIELD | Admitting: Physical Therapy

## 2018-02-28 DIAGNOSIS — Z6830 Body mass index (BMI) 30.0-30.9, adult: Secondary | ICD-10-CM | POA: Diagnosis not present

## 2018-02-28 DIAGNOSIS — Z124 Encounter for screening for malignant neoplasm of cervix: Secondary | ICD-10-CM | POA: Diagnosis not present

## 2018-02-28 DIAGNOSIS — Z01419 Encounter for gynecological examination (general) (routine) without abnormal findings: Secondary | ICD-10-CM | POA: Diagnosis not present

## 2018-02-28 DIAGNOSIS — R5383 Other fatigue: Secondary | ICD-10-CM | POA: Diagnosis not present

## 2018-02-28 DIAGNOSIS — Z0000 Encounter for general adult medical examination without abnormal findings: Secondary | ICD-10-CM | POA: Diagnosis not present

## 2018-08-21 ENCOUNTER — Other Ambulatory Visit: Payer: Self-pay | Admitting: Physician Assistant

## 2018-08-21 ENCOUNTER — Encounter: Payer: Self-pay | Admitting: Physician Assistant

## 2018-08-21 ENCOUNTER — Ambulatory Visit: Payer: BLUE CROSS/BLUE SHIELD | Admitting: Physician Assistant

## 2018-08-21 VITALS — BP 149/83 | HR 82 | Ht 61.0 in | Wt 158.0 lb

## 2018-08-21 DIAGNOSIS — Q839 Congenital malformation of breast, unspecified: Secondary | ICD-10-CM | POA: Diagnosis not present

## 2018-08-21 DIAGNOSIS — Z23 Encounter for immunization: Secondary | ICD-10-CM

## 2018-08-21 DIAGNOSIS — N644 Mastodynia: Secondary | ICD-10-CM

## 2018-08-21 DIAGNOSIS — R922 Inconclusive mammogram: Secondary | ICD-10-CM

## 2018-08-21 DIAGNOSIS — K5901 Slow transit constipation: Secondary | ICD-10-CM

## 2018-08-21 DIAGNOSIS — F411 Generalized anxiety disorder: Secondary | ICD-10-CM

## 2018-08-21 MED ORDER — VENLAFAXINE HCL ER 37.5 MG PO CP24: 37.5000 mg | ORAL_CAPSULE | Freq: Every day | ORAL | 3 refills | Status: DC

## 2018-08-21 NOTE — Patient Instructions (Signed)
Get mammogram that will give [place] better information about what could be causing pain.   Will call and get labs and report back any suggestions.

## 2018-08-21 NOTE — Progress Notes (Signed)
Subjective:    Patient ID: Danielle Marsh, female    DOB: 22-Jan-1971, 47 y.o.   MRN: [id number]  HPI Pt is a 47 yo female who presents to the clinic with a lumpy feelings on left breast with pain for last 2 weeks. Her breast pain started more constant and not more intermittent with sharp pains occasionally. She noticed her left nipple felt more flat than it used to. She denies any warmth or swelling or breast. No fever, chills, body aches, weight loss or night sweats. She is having more episodes of random nausea. She has never had a mammogram. She has never done self breast exams.  Her grandmother had breast cancer. Her father recently passed away from bone cancer.   More recently had more problems with constipation. Not tried anything to make better. Stools are hard and straining to get them out.   She does request effexor to be refilled. No problems or concerns. She has been on for years. No SI/HC. She has noticed more anxiety over her breast.   She is a current smoker.   She did recently see [facility] for pap. Labs were done.   .. Active Ambulatory Problems    Diagnosis Date Noted  . THROMBOCYTOSIS 10/14/2008  . EAR PAIN, RIGHT 10/14/2008  . EXTERNAL HEMORRHOIDS 10/18/2008  . Slow transit constipation 10/18/2008  . LEG PAIN, BILATERAL 10/14/2008  . Weakness 10/30/2014  . Loss of weight 10/30/2014  . Hair loss 10/30/2014  . Vision changes 10/30/2014  . Skin lesion of scalp 10/30/2014  . Hot flashes 10/30/2014  . Hematuria 10/30/2014  . RUQ pain 10/30/2014  . RLQ abdominal pain 10/30/2014  . Muscle cramps 10/30/2014  . Generalized anxiety disorder 10/30/2014  . Dense breast tissue 08/21/2018  . 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 Breast pain, left 08/21/2018  . Elevated alkaline phosphatase level 08/22/2018  . Elevated platelet count 08/22/2018   Resolved Ambulatory Problems    Diagnosis Date Noted  . No Resolved Ambulatory Problems   Past Medical History:  Diagnosis Date  .  Asthma       .Marland Kitchen Family History  Problem Relation Age of Onset  . Diabetes Mother   . Stroke Mother   . Hypertension Mother   . Diabetes Sister   . Hypertension Sister   . Cancer Sister        bone cancer  . Diabetes Brother   . Hypertension Brother   . Bone cancer Father   . Cancer - Other Father   . Colon cancer Maternal Aunt   . Lung cancer Maternal Aunt   . Cancer Maternal Grandmother        breast    Review of Systems See HPI.     Objective:   Physical Exam  Constitutional: She is oriented to person, place, and time. She appears well-developed and well-nourished.  HENT:  Head: Normocephalic and atraumatic.  Cardiovascular: Normal rate and regular rhythm.  Pulmonary/Chest: Effort normal and breath sounds normal.    Neurological: She is alert and oriented to person, place, and time.  Psychiatric: She has a normal mood and affect. Her behavior is normal.          Assessment & Plan:  Marland KitchenMarland KitchenDiagnoses and all orders for this visit:  Breast pain, left -     MM DIAG BREAST TOMO BILATERAL  Nipple anomaly -     MM DIAG BREAST TOMO BILATERAL  Dense breast tissue -     MM DIAG BREAST TOMO BILATERAL  Need for Tdap vaccination -     Tdap vaccine greater than or equal to 7yo IM  Need for immunization against influenza -     Flu Vaccine QUAD 36+ mos IM  Generalized anxiety disorder -     venlafaxine XR (EFFEXOR-XR) 37.5 MG 24 hr capsule; Take 1 capsule (37.5 mg total) by mouth daily with breakfast.  Slow transit constipation   .Marland Kitchen GAD 7 : Generalized Anxiety Score 08/22/2018  Nervous, Anxious, on Edge 1  Control/stop worrying 1  Worry too much - different things 1  Trouble relaxing 1  Restless 1  Easily annoyed or irritable 1  Afraid - awful might happen 1  Total GAD 7 Score 7  Anxiety Difficulty Somewhat difficult    .Marland Kitchen Depression screen PHQ 2/9 08/22/2018  Decreased Interest 0  Down, Depressed, Hopeless 1  PHQ - 2 Score 1  Altered sleeping 0    Tired, decreased energy 0  Change in appetite 0  Feeling bad or failure about yourself  0  Trouble concentrating 0  Moving slowly or fidgety/restless 0  Suicidal thoughts 0  PHQ-9 Score 1  Difficult doing work/chores Not difficult at all     No signs of breast infection. Nipple erect today. Will get mammogram ordered ASAP.  Pap done at [facility]. Will call to get copy.   Refilled effexor.   Discussed probiotics and/or miralax.

## 2018-08-22 ENCOUNTER — Telehealth: Payer: Self-pay | Admitting: Physician Assistant

## 2018-08-22 DIAGNOSIS — R748 Abnormal levels of other serum enzymes: Secondary | ICD-10-CM

## 2018-08-22 DIAGNOSIS — R7989 Other specified abnormal findings of blood chemistry: Secondary | ICD-10-CM

## 2018-08-22 NOTE — Telephone Encounter (Signed)
Called and notified patient of results. Patient states she would like to have platelets checked in 1 month. No further questions or concerns at this time.

## 2018-08-22 NOTE — Telephone Encounter (Signed)
Please make pending order for CBC with platelets to have checked in 1 month.

## 2018-08-22 NOTE — Telephone Encounter (Signed)
Call pt: I did get a copy of labs.  LDL was elevated. HDL was great.  Kidney, glucose looks great.  Platelets elevated. Recheck in a 1 month.  One liver enzyme is elevated.  Pap is negative for any abnormal cells. Follow up in 3 years with next pap.

## 2018-08-29 ENCOUNTER — Ambulatory Visit: Payer: BLUE CROSS/BLUE SHIELD

## 2018-08-29 ENCOUNTER — Ambulatory Visit
Admission: RE | Admit: 2018-08-29 | Discharge: 2018-08-29 | Disposition: A | Discharge status: Home / Self Care | Payer: BLUE CROSS/BLUE SHIELD | Source: Ambulatory Visit | Attending: Physician Assistant | Admitting: Physician Assistant

## 2018-08-29 DIAGNOSIS — R928 Other abnormal and inconclusive findings on diagnostic imaging of breast: Secondary | ICD-10-CM | POA: Diagnosis not present

## 2018-08-29 NOTE — Progress Notes (Signed)
[facility]. Normal mammogram. Are you still having breast pain?

## 2018-08-30 NOTE — Progress Notes (Signed)
Call pt:   Lots of warm compresses and gentle massage. Consider some food changes to help with fibrocystic breast.   .Cut out (or cut down on) caffeine. This means coffee, tea, soda, energy drinks, and chocolate. .Lower your intake of added sugar. Adrian Prows your sodium intake. .Limit the amount of fat in your diet. .Increase fiber intake to 30 grams a day. .Eat more fruits, vegetables, and whole grains.

## 2018-09-25 NOTE — Telephone Encounter (Signed)
Spoke with Pt, those labs were done in Jan 2019. Placed order for recheck CBC and CMP. She will get those completed this week.

## 2018-12-27 ENCOUNTER — Encounter: Payer: Self-pay | Admitting: Family Medicine

## 2018-12-27 ENCOUNTER — Ambulatory Visit: Payer: BLUE CROSS/BLUE SHIELD | Admitting: Family Medicine

## 2018-12-27 ENCOUNTER — Other Ambulatory Visit: Payer: Self-pay

## 2018-12-27 VITALS — BP 138/75 | HR 85 | Temp 97.9°F | Wt 157.0 lb

## 2018-12-27 DIAGNOSIS — R05 Cough: Secondary | ICD-10-CM

## 2018-12-27 DIAGNOSIS — J4541 Moderate persistent asthma with (acute) exacerbation: Secondary | ICD-10-CM | POA: Diagnosis not present

## 2018-12-27 DIAGNOSIS — J410 Simple chronic bronchitis: Secondary | ICD-10-CM | POA: Diagnosis not present

## 2018-12-27 DIAGNOSIS — J449 Chronic obstructive pulmonary disease, unspecified: Secondary | ICD-10-CM

## 2018-12-27 DIAGNOSIS — J45909 Unspecified asthma, uncomplicated: Secondary | ICD-10-CM | POA: Insufficient documentation

## 2018-12-27 DIAGNOSIS — R059 Cough, unspecified: Secondary | ICD-10-CM

## 2018-12-27 HISTORY — DX: Chronic obstructive pulmonary disease, unspecified: J44.9

## 2018-12-27 MED ORDER — ALBUTEROL SULFATE HFA 108 (90 BASE) MCG/ACT IN AERS: 1.0000 | INHALATION_SPRAY | Freq: Four times a day (QID) | RESPIRATORY_TRACT | 1 refills | Status: DC | PRN

## 2018-12-27 MED ORDER — BENZONATATE 200 MG PO CAPS: 200.0000 mg | ORAL_CAPSULE | Freq: Three times a day (TID) | ORAL | 0 refills | Status: DC | PRN

## 2018-12-27 MED ORDER — FLUTICASONE-UMECLIDIN-VILANT 100-62.5-25 MCG/INH IN AEPB: 1.0000 | INHALATION_SPRAY | Freq: Every day | RESPIRATORY_TRACT | 12 refills | Status: DC

## 2018-12-27 NOTE — Progress Notes (Signed)
Danielle Marsh is a 48 y.o. female who presents to [facility] [place] [facility] today for cough.  Danielle Marsh has a history of asthma and a reported history of COPD in the past.  She notes over the last few weeks she is developed itchy watery eyes runny nose sneezing and congestion.  She attributes the symptoms due to seasonal allergies which she has had in the past.  She is developed a small cough recently.  She denies significant wheezing but does note some chest tightness and occasional shortness of breath she notes is typically consistent with asthma.  She used her albuterol inhaler which has helped however she is run out of it.  She notes a baseline sometimes she has difficulty with exertion and developed shortness of breath.  She has been attributing this to her COPD but does not have any controller medications to it.  She notes it is been years since that spirometry test showed COPD.  Her daughter is sick with a similar illness.  Both the patient and her daughter deny any fevers chills body aches or significant headache.   ROS as above:  Exam:  BP 138/75   Pulse 85   Temp 97.9 F (36.6 C) (Oral)   Wt 157 lb (71.2 kg)   BMI 29.66 kg/m  Wt Readings from Last 5 Encounters:  12/27/18 157 lb (71.2 kg)  08/21/18 158 lb (71.7 kg)  11/14/17 156 lb (70.8 kg)  10/28/14 139 lb (63 kg)  10/26/14 140 lb (63.5 kg)    Gen: Well NAD HEENT: EOMI,  MMM clear nasal discharge inflamed nasal turbinates bilaterally.  Posterior pharynx with cobblestoning. Lungs: Normal work of breathing. CTABL slight prolonged expiratory phase. Heart: RRR no MRG Abd: NABS, Soft. Nondistended, Nontender Exts: Brisk capillary refill, warm and well perfused.   Lab and Radiology Results No results found for this or any previous visit (from the past 72 hour(s)). No results found.    Assessment and Plan: 48 y.o.  female with cough likely due to seasonal allergies and asthma.  Plan to treat seasonal allergies with Zyrtec and Flonase nasal spray.  We will treat asthma exacerbation with albuterol inhaler.  Additionally will treat with Trelegy as below.  Will use Tessalon Perles for cough suppression along with over-the-counter medications.  Patient has moderately controlled asthma and a reported diagnosis of COPD.  She certainly has at baseline some exertional symptoms concerning for obstructive lung disease.  Given her history of exacerbations and her baseline difficulty with expiratory phase I think Trelegy is a good option.  Plan to start Trelegy and recheck at some point in the future.  Patient likely will benefit from spirometry testing.   No orders of the defined types were placed in this encounter.  Meds ordered this encounter  Medications  . Fluticasone-Umeclidin-Vilant (TRELEGY ELLIPTA) 100-62.5-25 MCG/INH AEPB    Sig: Inhale 1 puff into the lungs daily.    Dispense:  30 each    Refill:  12  . albuterol (PROVENTIL HFA;VENTOLIN HFA) 108 (90 Base) MCG/ACT inhaler    Sig: Inhale 1-2 puffs into the lungs every 6 (six) hours as needed for wheezing or shortness of breath.    Dispense:  1 Inhaler    Refill:  1  . benzonatate (TESSALON) 200 MG capsule    Sig: Take 1 capsule (200 mg total) by mouth 3 (three) times daily as needed for cough.    Dispense:  45 capsule  Refill:  0     Historical information moved to improve visibility of documentation.  Past Medical History:  Diagnosis Date  . Asthma   . COPD (chronic obstructive pulmonary disease) ([place]) 12/27/2018   Past Surgical History:  Procedure Laterality Date  . CHOLECYSTECTOMY    . TUBAL LIGATION Bilateral    Social History   Tobacco Use  . Smoking status: Current Every Day Smoker  . Smokeless tobacco: Never Used  Substance Use Topics  . Alcohol use: No   family history includes Bone cancer in her father; Breast cancer in her  maternal grandmother and paternal grandmother; Cancer in her maternal grandmother and sister; Cancer - Other in her father; Colon cancer in her maternal aunt; Diabetes in her brother, mother, and sister; Hypertension in her brother, mother, and sister; Lung cancer in her maternal aunt; Stroke in her mother.  Medications: Current Outpatient Medications  Medication Sig Dispense Refill  . albuterol (PROVENTIL HFA;VENTOLIN HFA) 108 (90 Base) MCG/ACT inhaler Inhale 1-2 puffs into the lungs every 6 (six) hours as needed for wheezing or shortness of breath. 1 Inhaler 1  . escitalopram (LEXAPRO) 20 MG tablet escitalopram 20 mg tablet  TAKE 1/2 (ONE-HALF) TABLET BY MOUTH ONCE DAILY MAY INCREASE TO WHOLE TABLET.    Marland Kitchen benzonatate (TESSALON) 200 MG capsule Take 1 capsule (200 mg total) by mouth 3 (three) times daily as needed for cough. 45 capsule 0  . Fluticasone-Umeclidin-Vilant (TRELEGY ELLIPTA) 100-62.5-25 MCG/INH AEPB Inhale 1 puff into the lungs daily. 30 each 12   No current facility-administered medications for this visit.    No Known Allergies   Discussed warning signs or symptoms. Please see discharge instructions. Patient expresses understanding.

## 2018-12-27 NOTE — Patient Instructions (Addendum)
Thank you for coming in today. Call or go to the emergency room if you get worse, have trouble breathing, have chest pains, or palpitations.  I think you have asthma/allergy.  Take over the counter zyrtec (certizine).  Use flonase over the counter.   Use albuterol as needed.  Take trelegy daily.  Use tessalon pearles as needed.   Call or go to the emergency room if you get worse, have trouble breathing, have chest pains, or palpitations.    Asthma, Adult  Asthma is a long-term (chronic) condition that causes recurrent episodes in which the airways become tight and narrow. The airways are the passages that lead from the nose and mouth down into the lungs. Asthma episodes, also called asthma attacks, can cause coughing, wheezing, shortness of breath, and chest pain. The airways can also fill with mucus. During an attack, it can be difficult to breathe. Asthma attacks can range from minor to life threatening. Asthma cannot be cured, but medicines and lifestyle changes can help control it and treat acute attacks. What are the causes? This condition is believed to be caused by inherited (genetic) and environmental factors, but its exact cause is not known. There are many things that can bring on an asthma attack or make asthma symptoms worse (triggers). Asthma triggers are different for each person. Common triggers include:  Mold.  Dust.  Cigarette smoke.  Cockroaches.  Things that can cause allergy symptoms (allergens), such as animal dander or pollen from trees or grass.  Air pollutants such as household cleaners, wood smoke, smog, or Ad[employer] account planner.  Cold air, weather changes, and winds (which increase molds and pollen in the air).  Strong emotional expressions such as crying or laughing hard.  Stress.  Certain medicines (such as aspirin) or types of medicines (such as beta-blockers).  Sulfites in foods and drinks. Foods and drinks that may contain sulfites include dried fruit,  potato chips, and sparkling grape juice.  Infections or inflammatory conditions such as the flu, a cold, or inflammation of the nasal membranes (rhinitis).  Gastroesophageal reflux disease (GERD).  Exercise or strenuous activity. What are the signs or symptoms? Symptoms of this condition may occur right after asthma is triggered or many hours later. Symptoms include:  Wheezing. This can sound like whistling when you breathe.  Excessive nighttime or early morning coughing.  Frequent or severe coughing with a common cold.  Chest tightness.  Shortness of breath.  Tiredness (fatigue) with minimal activity. How is this diagnosed? This condition is diagnosed based on:  Your medical history.  A physical exam.  Tests, which may include: ? Lung function studies and pulmonary studies (spirometry). These tests can evaluate the flow of air in your lungs. ? Allergy tests. ? Imaging tests, such as X-rays. How is this treated? There is no cure for this condition, but treatment can help control your symptoms. Treatment for asthma usually involves:  Identifying and avoiding your asthma triggers.  Using medicines to control your symptoms. Generally, two types of medicines are used to treat asthma: ? Controller medicines. These help prevent asthma symptoms from occurring. They are usually taken every day. ? Fast-acting reliever or rescue medicines. These quickly relieve asthma symptoms by widening the narrow and tight airways. They are used as needed and provide short-term relief.  Using supplemental oxygen. This may be needed during a severe episode.  Using other medicines, such as: ? Allergy medicines, such as antihistamines, if your asthma attacks are triggered by allergens. ? Immune medicines (immunomodulators).  These are medicines that help control the immune system.  Creating an asthma action plan. An asthma action plan is a written plan for managing and treating your asthma  attacks. This plan includes: ? A list of your asthma triggers and how to avoid them. ? Information about when medicines should be taken and when their dosage should be changed. ? Instructions about using a device called a peak flow meter. A peak flow meter measures how well the lungs are working and the severity of your asthma. It helps you monitor your condition. Follow these instructions at home: Controlling your home environment Control your home environment in the following ways to help avoid triggers and prevent asthma attacks:  Change your heating and air conditioning filter regularly.  Limit your use of fireplaces and wood stoves.  Get rid of pests (such as roaches and mice) and their droppings.  Throw away plants if you see mold on them.  Clean floors and dust surfaces regularly. Use unscented cleaning products.  Try to have someone else vacuum for you regularly. Stay out of rooms while they are being vacuumed and for a short while afterward. If you vacuum, use a dust mask from a hardware store, a double-layered or microfilter vacuum cleaner bag, or a vacuum cleaner with a HEPA filter.  Replace carpet with wood, tile, or vinyl flooring. Carpet can trap dander and dust.  Use allergy-proof pillows, mattress covers, and box spring covers.  Keep your bedroom a trigger-free room.  Avoid pets and keep windows closed when allergens are in the air.  Wash beddings every week in hot water and dry them in a dryer.  Use blankets that are made of polyester or cotton.  Clean bathrooms and kitchens with bleach. If possible, have someone repaint the walls in these rooms with mold-resistant paint. Stay out of the rooms that are being cleaned and painted.  Wash your hands often with soap and water. If soap and water are not available, use hand sanitizer.  Do not allow anyone to smoke in your home. General instructions  Take over-the-counter and prescription medicines only as told by your  health care provider. ? Speak with your health care provider if you have questions about how or when to take the medicines. ? Make note if you are requiring more frequent dosages.  Do not use any products that contain nicotine or tobacco, such as cigarettes and e-cigarettes. If you need help quitting, ask your health care provider. Also, avoid being exposed to secondhand smoke.  Use a peak flow meter as told by your health care provider. Record and keep track of the readings.  Understand and use the asthma action plan to help minimize, or stop an asthma attack, without needing to seek medical care.  Make sure you stay up to date on your yearly vaccinations as told by your health care provider. This may include vaccines for the flu and pneumonia.  Avoid outdoor activities when allergen counts are high and when air quality is low.  Wear a ski mask that covers your nose and mouth during outdoor winter activities. Exercise indoors on cold days if you can.  Warm up before exercising, and take time for a cool-down period after exercise.  Keep all follow-up visits as told by your health care provider. This is important. Where to find more information  For information about asthma, turn to the Centers for Disease Control and Prevention at http://www.clark.net/.htm  For air quality information, turn to AirNow at WeightRating.nl Contact  a health care provider if:  You have wheezing, shortness of breath, or a cough even while you are taking medicine to prevent attacks.  The mucus you cough up (sputum) is thicker than usual.  Your sputum changes from clear or white to yellow, green, gray, or bloody.  Your medicines are causing side effects, such as a rash, itching, swelling, or trouble breathing.  You need to use a reliever medicine more than 2-3 times a week.  Your peak flow reading is still at 50-79% of your personal best after following your action plan for 1 hour.  You have a  fever. Get help right away if:  You are getting worse and do not respond to treatment during an asthma attack.  You are short of breath when at rest or when doing very little physical activity.  You have difficulty eating, drinking, or talking.  You have chest pain or tightness.  You develop a fast heartbeat or palpitations.  You have a bluish color to your lips or fingernails.  You are light-headed or dizzy, or you faint.  Your peak flow reading is less than 50% of your personal best.  You feel too tired to breathe normally. Summary  Asthma is a long-term (chronic) condition that causes recurrent episodes in which the airways become tight and narrow. These episodes can cause coughing, wheezing, shortness of breath, and chest pain.  Asthma cannot be cured, but medicines and lifestyle changes can help control it and treat acute attacks.  Make sure you understand how to avoid triggers and how and when to use your medicines.  Asthma attacks can range from minor to life threatening. Get help right away if you have an asthma attack and do not respond to treatment with your usual rescue medicines. This information is not intended to replace advice given to you by your health care provider. Make sure you discuss any questions you have with your health care provider. Document Released: 09/27/2005 Document Revised: 11/01/2016 Document Reviewed: 11/01/2016 Elsevier Interactive Patient Education  2019 [employer].

## 2019-01-11 ENCOUNTER — Ambulatory Visit (INDEPENDENT_AMBULATORY_CARE_PROVIDER_SITE_OTHER): Payer: BLUE CROSS/BLUE SHIELD | Admitting: Sports Medicine

## 2019-01-11 ENCOUNTER — Ambulatory Visit (INDEPENDENT_AMBULATORY_CARE_PROVIDER_SITE_OTHER): Payer: BLUE CROSS/BLUE SHIELD

## 2019-01-11 ENCOUNTER — Encounter: Payer: Self-pay | Admitting: Sports Medicine

## 2019-01-11 ENCOUNTER — Other Ambulatory Visit: Payer: Self-pay

## 2019-01-11 DIAGNOSIS — Z98818 Other dental procedure status: Secondary | ICD-10-CM | POA: Diagnosis not present

## 2019-01-11 DIAGNOSIS — T8132XA Disruption of internal operation (surgical) wound, not elsewhere classified, initial encounter: Secondary | ICD-10-CM

## 2019-01-11 DIAGNOSIS — R220 Localized swelling, mass and lump, head: Secondary | ICD-10-CM

## 2019-01-11 DIAGNOSIS — R519 Headache, unspecified: Secondary | ICD-10-CM

## 2019-01-11 DIAGNOSIS — J320 Chronic maxillary sinusitis: Secondary | ICD-10-CM

## 2019-01-11 DIAGNOSIS — R51 Headache: Secondary | ICD-10-CM | POA: Diagnosis not present

## 2019-01-11 DIAGNOSIS — J0100 Acute maxillary sinusitis, unspecified: Secondary | ICD-10-CM | POA: Diagnosis not present

## 2019-01-11 MED ORDER — PREDNISONE 50 MG PO TABS: ORAL_TABLET | ORAL | 0 refills | Status: DC

## 2019-01-11 MED ORDER — HYDROCODONE-ACETAMINOPHEN 5-325 MG PO TABS: 1.0000 | ORAL_TABLET | Freq: Three times a day (TID) | ORAL | 0 refills | Status: DC | PRN

## 2019-01-11 MED ORDER — AMOXICILLIN-POT CLAVULANATE 875-125 MG PO TABS: 1.0000 | ORAL_TABLET | Freq: Two times a day (BID) | ORAL | 0 refills | Status: DC

## 2019-01-11 NOTE — Progress Notes (Signed)
Subjective:    CC: Severe facial pain  HPI: For the past few days this pleasant 48 year old female has had severe facial pain, swelling on the right, she did have a couple of maxillary molars pulled on the right.  She has started to have some chills, difficulty breathing through her nose.  No fevers, skin rash, GI symptoms, symptoms are severe, worsening.  Significant watery eyes, nasal drainage, purulent and bloody.  I reviewed the past medical history, family history, social history, surgical history, and allergies today and no changes were needed.  Please see the problem list section below in epic for further details.  Past Medical History: Past Medical History:  Diagnosis Date  . Asthma   . COPD (chronic obstructive pulmonary disease) ([place]) 12/27/2018   Past Surgical History: Past Surgical History:  Procedure Laterality Date  . CHOLECYSTECTOMY    . TUBAL LIGATION Bilateral    Social History: Social History   Socioeconomic History  . Marital status: Married    Spouse name: Not on file  . Number of children: Not on file  . Years of education: Not on file  . Highest education level: Not on file  Occupational History  . Not on file  Social Needs  . Financial resource strain: Not on file  . Food insecurity:    Worry: Not on file    Inability: Not on file  . Transportation needs:    Medical: Not on file    Non-medical: Not on file  Tobacco Use  . Smoking status: Current Every Day Smoker  . Smokeless tobacco: Never Used  Substance and Sexual Activity  . Alcohol use: No  . Drug use: No  . Sexual activity: Not on file  Lifestyle  . Physical activity:    Days per week: Not on file    Minutes per session: Not on file  . Stress: Not on file  Relationships  . Social connections:    Talks on phone: Not on file    Gets together: Not on file    Attends religious service: Not on file    Active member of club or organization: Not on file    Attends meetings of clubs or  organizations: Not on file    Relationship status: Not on file  Other Topics Concern  . Not on file  Social History Narrative  . Not on file   Family History: Family History  Problem Relation Age of Onset  . Diabetes Mother   . Stroke Mother   . Hypertension Mother   . Diabetes Sister   . Hypertension Sister   . Cancer Sister        bone cancer  . Diabetes Brother   . Hypertension Brother   . Bone cancer Father   . Cancer - Other Father   . Colon cancer Maternal Aunt   . Lung cancer Maternal Aunt   . Cancer Maternal Grandmother        breast  . Breast cancer Maternal Grandmother   . Breast cancer Paternal Grandmother    Allergies: No Known Allergies Medications: See med rec.  Review of Systems: No fevers, chills, night sweats, weight loss, chest pain, or shortness of breath.   Objective:    General: Well Developed, well nourished, and in no acute distress.  Neuro: Alert and oriented x3, extra-ocular muscles intact, sensation grossly intact.  HEENT: Normocephalic, atraumatic, pupils equal round reactive to light, neck supple, no masses, no lymphadenopathy, thyroid nonpalpable.  Oropharynx shows missing right maxillary  molars, no overt purulent drainage.  She has severe tenderness of her maxillary sinuses as well as the bridge of her nose.  The right side of her face does appear somewhat swollen.  Eyes are unremarkable with no proptosis, no hyphema, no anterior chamber chemosis. Skin: Warm and dry, no rashes. Cardiac: Regular rate and rhythm, no murmurs rubs or gallops, no lower extremity edema.  Respiratory: Clear to auscultation bilaterally. Not using accessory muscles, speaking in full sentences.  Impression and Recommendations:    Facial pain Severe right-sided facial pain, radiation into the maxillary sinuses, nose, just had maxillary molars pulled on the right. She was unable to reach her dentist as they are closed. Good thing we are still open and seeing patients.  Considering severe pain, nasal discharge, constitutional symptoms I do think we need a CT of her sinuses. Adding prednisone, Augmentin, hydrocodone for pain. Return to see me in 1 week.   ___________________________________________ Gwen Her. Dianah Field, M.D., ABFM., CAQSM. Primary Care and Sports Medicine [facility]  Adjunct Professor of [place] [employer] [facility] [employer]

## 2019-01-11 NOTE — Assessment & Plan Note (Signed)
Severe right-sided facial pain, radiation into the maxillary sinuses, nose, just had maxillary molars pulled on the right. She was unable to reach her dentist as they are closed. Good thing we are still open and seeing patients. Considering severe pain, nasal discharge, constitutional symptoms I do think we need a CT of her sinuses. Adding prednisone, Augmentin, hydrocodone for pain. Return to see me in 1 week.

## 2019-01-18 ENCOUNTER — Encounter: Payer: Self-pay | Admitting: Sports Medicine

## 2019-01-18 ENCOUNTER — Ambulatory Visit (INDEPENDENT_AMBULATORY_CARE_PROVIDER_SITE_OTHER): Payer: BLUE CROSS/BLUE SHIELD | Admitting: Sports Medicine

## 2019-01-18 ENCOUNTER — Other Ambulatory Visit: Payer: Self-pay

## 2019-01-18 DIAGNOSIS — J320 Chronic maxillary sinusitis: Secondary | ICD-10-CM

## 2019-01-18 DIAGNOSIS — J410 Simple chronic bronchitis: Secondary | ICD-10-CM | POA: Diagnosis not present

## 2019-01-18 MED ORDER — FLUTICASONE-UMECLIDIN-VILANT 100-62.5-25 MCG/INH IN AEPB: 1.0000 | INHALATION_SPRAY | Freq: Every day | RESPIRATORY_TRACT | 12 refills | Status: AC

## 2019-01-18 MED ORDER — ALBUTEROL SULFATE HFA 108 (90 BASE) MCG/ACT IN AERS: 1.0000 | INHALATION_SPRAY | Freq: Four times a day (QID) | RESPIRATORY_TRACT | 1 refills | Status: AC | PRN

## 2019-01-18 MED ORDER — AMBULATORY NON FORMULARY MEDICATION: 0 refills | Status: DC

## 2019-01-18 NOTE — Progress Notes (Signed)
Virtual Visit via Telephone   I connected with  Danielle Marsh  on 01/18/19 by telephone and verified that I am speaking with the correct person using two identifiers.   I discussed the limitations, risks, security and privacy concerns of performing an evaluation and management service by telephone, including the higher likelihood of inaccurate diagnosis and treatment, and the availability of in person appointments.  We also discussed the likely need of an additional face to face encounter for complete and high quality delivery of care.  I also discussed with the patient that there may be a patient responsible charge related to this service. The patient expressed understanding and wishes to proceed.  Subjective:    CC: Follow-up  HPI: Acute sinusitis: Significantly better with Augmentin, prednisone.  This occurred after a dental extraction and she was unable to reach her dentist.  She did get some sores on the inside of her mouth, but overall feeling better.  COPD: Needs a refill on her albuterol, she would like an incentive spirometer, and she never got the Trelegy for some reason.  I reviewed the past medical history, family history, social history, surgical history, and allergies today and no changes were needed.  Please see the problem list section below in epic for further details.  Past Medical History: Past Medical History:  Diagnosis Date  . Asthma   . COPD (chronic obstructive pulmonary disease) ([place]) 12/27/2018   Past Surgical History: Past Surgical History:  Procedure Laterality Date  . CHOLECYSTECTOMY    . TUBAL LIGATION Bilateral    Social History: Social History   Socioeconomic History  . Marital status: Married    Spouse name: Not on file  . Number of children: Not on file  . Years of education: Not on file  . Highest education level: Not on file  Occupational History  . Not on file  Social Needs  . Financial resource strain: Not on file  . Food insecurity:     Worry: Not on file    Inability: Not on file  . Transportation needs:    Medical: Not on file    Non-medical: Not on file  Tobacco Use  . Smoking status: Current Every Day Smoker  . Smokeless tobacco: Never Used  Substance and Sexual Activity  . Alcohol use: No  . Drug use: No  . Sexual activity: Not on file  Lifestyle  . Physical activity:    Days per week: Not on file    Minutes per session: Not on file  . Stress: Not on file  Relationships  . Social connections:    Talks on phone: Not on file    Gets together: Not on file    Attends religious service: Not on file    Active member of club or organization: Not on file    Attends meetings of clubs or organizations: Not on file    Relationship status: Not on file  Other Topics Concern  . Not on file  Social History Narrative  . Not on file   Family History: Family History  Problem Relation Age of Onset  . Diabetes Mother   . Stroke Mother   . Hypertension Mother   . Diabetes Sister   . Hypertension Sister   . Cancer Sister        bone cancer  . Diabetes Brother   . Hypertension Brother   . Bone cancer Father   . Cancer - Other Father   . Colon cancer Maternal Aunt   .  Lung cancer Maternal Aunt   . Cancer Maternal Grandmother        breast  . Breast cancer Maternal Grandmother   . Breast cancer Paternal Grandmother    Allergies: No Known Allergies Medications: See med rec.  Review of Systems: No fevers, chills, night sweats, weight loss, chest pain, or shortness of breath.   Objective:    General: Speaking full sentences, no audible heavy breathing.  Sounds alert and appropriately interactive.  No other physical exam performed due to the non-face to face nature of this visit.  Impression and Recommendations:    Right maxillary sinusitis Severe right-sided maxillary sinus with opacification of the sinus itself on facial CT post maxillary molar extraction. She was unable to reach her dentist. A  thousand times better with Augmentin, prednisone, hydrocodone. She has developed some sores on the inside of her mouth, this started when the prednisone finished so we will add an additional 12-day taper course of prednisone. Return to see me in 2 weeks if needed.  I discussed the above assessment and treatment plan with the patient. The patient was provided an opportunity to ask questions and all were answered. The patient agreed with the plan and demonstrated an understanding of the instructions.   The patient was advised to call back or seek an in-person evaluation if the symptoms worsen or if the condition fails to improve as anticipated.   I provided 21 minutes of non-face-to-face time during this encounter, less than 50% of this was time needed to gather information, review chart, records, and complete documentation.   ___________________________________________ Gwen Her. Dianah Field, M.D., ABFM., CAQSM. Primary Care and Sports Medicine [facility]  Adjunct Professor of [place] [employer] [facility] of Medicine

## 2019-01-18 NOTE — Assessment & Plan Note (Signed)
Severe right-sided maxillary sinus with opacification of the sinus itself on facial CT post maxillary molar extraction. She was unable to reach her dentist. A thousand times better with Augmentin, prednisone, hydrocodone. She has developed some sores on the inside of her mouth, this started when the prednisone finished so we will add an additional 12-day taper course of prednisone. Return to see me in 2 weeks if needed.

## 2019-02-27 ENCOUNTER — Ambulatory Visit (INDEPENDENT_AMBULATORY_CARE_PROVIDER_SITE_OTHER): Payer: BLUE CROSS/BLUE SHIELD | Admitting: Family Medicine

## 2019-02-27 ENCOUNTER — Encounter: Payer: Self-pay | Admitting: Family Medicine

## 2019-02-27 VITALS — BP 125/73 | HR 80 | Temp 97.9°F | Ht 61.0 in | Wt 155.0 lb

## 2019-02-27 DIAGNOSIS — K625 Hemorrhage of anus and rectum: Secondary | ICD-10-CM | POA: Diagnosis not present

## 2019-02-27 NOTE — Patient Instructions (Signed)
Thank you for coming in today. Get labs and stool sample today.  You will hear about gastroenterology soon.  Let me know if you do not hear anything.    Rectal Bleeding  Rectal bleeding is when blood passes out of the anus. People with rectal bleeding may notice bright red blood in their underwear or in the toilet after having a bowel movement. They may also have dark red or black stools. Rectal bleeding is usually a sign that something is wrong. Many things can cause rectal bleeding, including:  Hemorrhoids. These are blood vessels in the anus or rectum that are larger than normal.  Fistulas. These are abnormal passages in the rectum and anus.  Anal fissures. This is a tear in the anus.  Diverticulosis. This is a condition in which pockets or sacs project from the bowel.  Proctitis and colitis. These are conditions in which the rectum, colon, or anus become inflamed.  Polyps. These are growths that can be cancerous (malignant) or non-cancerous (benign).  Part of the rectum sticking out from the anus (rectal prolapse).  Certain medicines.  Intestinal infections. Follow these instructions at home: Pay attention to any changes in your symptoms. Take these actions to help lessen bleeding and discomfort:  Eat a diet that is high in fiber. This will keep your stool soft, making it easier to pass stools without straining. Ask your health care provider what foods and drinks are high in fiber.  Drink enough fluid to keep your urine clear or pale yellow. This also helps to keep your stool soft.  Try taking a warm bath. This may help soothe any pain in your rectum.  Keep all follow-up visits as told by your health care provider. This is important. Get help right away if:  You have new or increased rectal bleeding.  You have black or dark red stools.  You vomit blood or something that looks like coffee grounds.  You have pain or tenderness in your abdomen.  You have a fever.   You feel weak.  You feel nauseous.  You faint.  You have severe pain in your rectum.  You cannot have a bowel movement. This information is not intended to replace advice given to you by your health care provider. Make sure you discuss any questions you have with your health care provider. Document Released: 03/19/2002 Document Revised: 03/04/2016 Document Reviewed: 11/23/2015 Elsevier Interactive Patient Education  2019 [employer].

## 2019-02-27 NOTE — Progress Notes (Signed)
Danielle Marsh is a 48 y.o. female who presents to [facility]: [facility] today for blood in stool for 2 months.  Patient notes that she has blood in the stool associated with mucus.  This is been ongoing for 2 months.  She occasionally have abdominal pain and cramping.  She notes a long history preceding her blood in stool of alternating waxing and waning constipation and diarrhea.  She passed a blood clot yesterday and is worried that her symptoms seem to be worsening.  She denies any fevers or chills nausea or vomiting or unexplained weight loss.  She was exposed to Augmentin in early April but her bleeding preceded that.  She thinks she may have had a colonoscopy years ago in [facility] but cannot remember what the results were.  She thinks it was normal.  She notes that she does have a family history of colon cancer.   ROS as above:  Exam:  BP 125/73   Pulse 80   Temp 97.9 F (36.6 C) (Oral)   Ht 5\' 1"  (1.549 m)   Wt 155 lb (70.3 kg)   SpO2 99%   BMI 29.29 kg/m  Wt Readings from Last 5 Encounters:  02/27/19 155 lb (70.3 kg)  12/27/18 157 lb (71.2 kg)  08/21/18 158 lb (71.7 kg)  11/14/17 156 lb (70.8 kg)  10/28/14 139 lb (63 kg)    Gen: Well NAD HEENT: EOMI,  MMM Lungs: Normal work of breathing. CTABL Heart: RRR no MRG Abd: NABS, Soft. Nondistended, mildly tender right upper quadrant with no rebound or guarding. Exts: Brisk capillary refill, warm and well perfused.  Rectal exam normal-appearing anus.  No rectal masses.  No visible blood on digital rectal exam   Patient provided a photo of blood stained underwear.       Assessment and Plan: 48 y.o. female with  Rectal bleeding associated with mild abdominal pain and waxing and waning constipation and diarrhea.  Etiology is unclear and will proceed with broad work-up including stool culture CBC and  metabolic panel and sed rate.  Given her ongoing rectal bleeding I am anticipating anemia and will check iron stores as well with ferritin TIBC and total iron.  Given her exam and history I think it is very likely that she will benefit from colonoscopy.  Will refer to gastroenterology now for anticipated evaluation and colonoscopy in the near future.  PDMP not reviewed this encounter. Orders Placed This Encounter  Procedures  . Stool Culture  . CBC with Differential/Platelet  . COMPLETE METABOLIC PANEL WITH GFR  . Sedimentation rate  . Fe+TIBC+Fer  . Ambulatory referral to Gastroenterology    Referral Priority:   Routine    Referral Type:   Consultation    Referral Reason:   Specialty Services Required    Number of Visits Requested:   1   No orders of the defined types were placed in this encounter.    Historical information moved to improve visibility of documentation.  Past Medical History:  Diagnosis Date  . Asthma   . COPD (chronic obstructive pulmonary disease) ([place]) 12/27/2018   Past Surgical History:  Procedure Laterality Date  . CHOLECYSTECTOMY    . TUBAL LIGATION Bilateral    Social History   Tobacco Use  . Smoking status: Current Every Day Smoker  . Smokeless tobacco: Never Used  Substance Use Topics  . Alcohol use: No   family history includes Bone  cancer in her father; Breast cancer in her maternal grandmother and paternal grandmother; Cancer in her maternal grandmother and sister; Cancer - Other in her father; Colon cancer in her maternal aunt; Diabetes in her brother, mother, and sister; Hypertension in her brother, mother, and sister; Lung cancer in her maternal aunt; Stroke in her mother.  Medications: Current Outpatient Medications  Medication Sig Dispense Refill  . albuterol (PROVENTIL HFA;VENTOLIN HFA) 108 (90 Base) MCG/ACT inhaler Inhale 1-2 puffs into the lungs every 6 (six) hours as needed for wheezing or shortness of breath. 1 Inhaler 1  .  AMBULATORY NON FORMULARY MEDICATION Incentive spirometer, use every 1-2 hours. 1 each 0  . escitalopram (LEXAPRO) 20 MG tablet escitalopram 20 mg tablet  TAKE 1/2 (ONE-HALF) TABLET BY MOUTH ONCE DAILY MAY INCREASE TO WHOLE TABLET.    Marland Kitchen Fluticasone-Umeclidin-Vilant (TRELEGY ELLIPTA) 100-62.5-25 MCG/INH AEPB Inhale 1 puff into the lungs daily. 30 each 12   No current facility-administered medications for this visit.    No Known Allergies   Discussed warning signs or symptoms. Please see discharge instructions. Patient expresses understanding.

## 2019-02-28 ENCOUNTER — Encounter: Payer: Self-pay | Admitting: Gastroenterology

## 2019-02-28 ENCOUNTER — Telehealth (INDEPENDENT_AMBULATORY_CARE_PROVIDER_SITE_OTHER): Payer: BLUE CROSS/BLUE SHIELD | Admitting: Gastroenterology

## 2019-02-28 ENCOUNTER — Other Ambulatory Visit: Payer: Self-pay

## 2019-02-28 VITALS — Ht 63.0 in | Wt 165.0 lb

## 2019-02-28 DIAGNOSIS — R7989 Other specified abnormal findings of blood chemistry: Secondary | ICD-10-CM

## 2019-02-28 DIAGNOSIS — R110 Nausea: Secondary | ICD-10-CM

## 2019-02-28 DIAGNOSIS — R197 Diarrhea, unspecified: Secondary | ICD-10-CM | POA: Diagnosis not present

## 2019-02-28 DIAGNOSIS — R194 Change in bowel habit: Secondary | ICD-10-CM | POA: Diagnosis not present

## 2019-02-28 DIAGNOSIS — R634 Abnormal weight loss: Secondary | ICD-10-CM

## 2019-02-28 DIAGNOSIS — K921 Melena: Secondary | ICD-10-CM

## 2019-02-28 DIAGNOSIS — Z800 Family history of malignant neoplasm of digestive organs: Secondary | ICD-10-CM

## 2019-02-28 DIAGNOSIS — R1030 Lower abdominal pain, unspecified: Secondary | ICD-10-CM

## 2019-02-28 LAB — COMPLETE METABOLIC PANEL WITH GFR
AG Ratio: 2.1 (calc) (ref 1.0–2.5)
ALT: 14 U/L (ref 6–29)
AST: 16 U/L (ref 10–35)
Albumin: 4.5 g/dL (ref 3.6–5.1)
Alkaline phosphatase (APISO): 87 U/L (ref 31–125)
BUN: 13 mg/dL (ref 7–25)
CO2: 29 mmol/L (ref 20–32)
Calcium: 9.8 mg/dL (ref 8.6–10.2)
Chloride: 104 mmol/L (ref 98–110)
Creat: 0.85 mg/dL (ref 0.50–1.10)
GFR, Est African American: 95 mL/min/{1.73_m2} (ref >=60)
GFR, Est Non African American: 82 mL/min/{1.73_m2} (ref >=60)
Globulin: 2.1 g/dL (calc) (ref 1.9–3.7)
Glucose, Bld: 91 mg/dL (ref 65–99)
Potassium: 4.7 mmol/L (ref 3.5–5.3)
Sodium: 140 mmol/L (ref 135–146)
Total Bilirubin: 0.4 mg/dL (ref 0.2–1.2)
Total Protein: 6.6 g/dL (ref 6.1–8.1)

## 2019-02-28 LAB — CBC WITH DIFFERENTIAL/PLATELET
Absolute Monocytes: 615 cells/uL (ref 200–950)
Basophils Absolute: 38 cells/uL (ref 0–200)
Basophils Relative: 0.5 %
Eosinophils Absolute: 180 cells/uL (ref 15–500)
Eosinophils Relative: 2.4 %
HCT: 38.4 % (ref 35.0–45.0)
Hemoglobin: 12.9 g/dL (ref 11.7–15.5)
Lymphs Abs: 2835 cells/uL (ref 850–3900)
MCH: 31.5 pg (ref 27.0–33.0)
MCHC: 33.6 g/dL (ref 32.0–36.0)
MCV: 93.9 fL (ref 80.0–100.0)
MPV: 8.9 fL (ref 7.5–12.5)
Monocytes Relative: 8.2 %
Neutro Abs: 3833 cells/uL (ref 1500–7800)
Neutrophils Relative %: 51.1 %
Platelets: 630 10*3/uL — ABNORMAL HIGH (ref 140–400)
RBC: 4.09 10*6/uL (ref 3.80–5.10)
RDW: 12.1 % (ref 11.0–15.0)
Total Lymphocyte: 37.8 %
WBC: 7.5 10*3/uL (ref 3.8–10.8)

## 2019-02-28 LAB — IRON,TIBC AND FERRITIN PANEL
%SAT: 31 % (calc) (ref 16–45)
Ferritin: 70 ng/mL (ref 16–232)
Iron: 95 ug/dL (ref 40–190)
TIBC: 303 mcg/dL (calc) (ref 250–450)

## 2019-02-28 LAB — SEDIMENTATION RATE: Sed Rate: 28 mm/h — ABNORMAL HIGH (ref 0–20)

## 2019-02-28 NOTE — Addendum Note (Signed)
Addended by: Herma Mering D on: 02/28/2019 02:02 PM   Modules accepted: Orders

## 2019-02-28 NOTE — Patient Instructions (Signed)
To help prevent the possible spread of infection to our patients, communities, and staff; we will be implementing the following measures:  As of now we are not allowing any visitors/family members to accompany you to any upcoming appointments with [facility]. If you have any concerns about this please contact our office to discuss prior to the appointment.   You have been scheduled for an endoscopy and colonoscopy. Please follow the written instructions given to you at your visit today. Please pick up your prep supplies at the pharmacy within the next 1-3 days. If you use inhalers (even only as needed), please bring them with you on the day of your procedure. Your physician has requested that you go to www.startemmi.com and enter the access code given to you at your visit today. This web site gives a general overview about your procedure. However, you should still follow specific instructions given to you by our office regarding your preparation for the procedure.  We have sent the following medications to your pharmacy for you to pick up at your convenience: Clenpiq  It was a pleasure to see you today!  Vito Cirigliano, D.O.

## 2019-02-28 NOTE — Progress Notes (Signed)
Chief Complaint: Hematochezia, change in bowel habits, abdominal pain   Referring Provider:     Gregor Hams, MD   HPI:    Due to current restrictions/limitations of in-office visits due to the COVID-19 pandemic, this scheduled clinical appointment was converted to a telehealth virtual consultation using D[employer]imity.  -Time of medical discussion: 25 minutes -The patient did consent to this virtual visit and is aware of possible charges through their insurance for this visit.  -Names of all parties present: Danielle Marsh (patient), Gerrit Heck, DO, [facility] (physician) -Patient location: Home -Physician location: Office  Danielle Marsh is a 48 y.o. female referred to the Gastroenterology Clinic for evaluation of hematochezia, change in bowel habits, abdominal pain/cramping.  States she has been having intermittent BRBPR for the last 2+ months. Endorses mucus-like stools. Can have up to 3 loose/watery stools/day (baseline 1 formed stool/day). +tenesmus. +nocturnal stools. Passed clot x1. Lower abdominal pain without radiation. Also recent onset fecal seepage. +nausea x1 without emesis. Has lost weight. Unsure how much as she doesn't have scale at home, but clothes fit looser now.    Additionally she endorses nausea essentially daily, exacerbated by smell of foods over the last couple of weeks. Only eats a couple bites, then stops due to nausea (not early satiety).  Decreased appetite.  No dysphagia. No reflux sxs.  Was seen by her PCM yesterday.  Rectal exam essentially normal.  Has a longer-standing hx of alternating bowel habits for years, with inttermittent abdominal pain, but those symptoms seem much different from these.  Evaluation to date notable for normal WBC, H/H, elevated platelets (630), normal CMP, mildly elevated ESR (28), normal iron panel.  Stool studies ordered but not yet done.  No recent imaging. CT in 2016 essentially normal (previous [facility]).  Colonoscopy in  [place], [place] in her 20's for abdominal pain-  does not recall results.  No previous EGD.  FHx n/f father with CA of unknown primary but mets to bone, paternal uncle with CRC, maternal uncle with CRC.   Past medical history, past surgical history, social history, family history, medications, and allergies reviewed in the chart and with patient.    Past Medical History:  Diagnosis Date  . Asthma   . COPD (chronic obstructive pulmonary disease) ([place]) 12/27/2018     Past Surgical History:  Procedure Laterality Date  . CHOLECYSTECTOMY    . COLONOSCOPY     in patient's 20's in [place]  . TUBAL LIGATION Bilateral    Family History  Problem Relation Age of Onset  . Diabetes Mother   . Stroke Mother   . Hypertension Mother   . Diabetes Sister   . Hypertension Sister   . Cancer Sister        bone cancer  . Diabetes Brother   . Hypertension Brother   . Bone cancer Father   . Cancer - Other Father   . Colon cancer Maternal Aunt   . Cancer Maternal Grandmother        breast  . Breast cancer Maternal Grandmother   . Breast cancer Paternal Grandmother   . Cancer Paternal Grandmother        had to remove some of her jaw/possible bone  . Lung cancer Maternal Aunt   . Colon cancer Maternal Uncle   . Colon cancer Maternal Aunt   . Esophageal cancer Neg Hx    Social History   Tobacco Use  .  Smoking status: Current Every Day Smoker  . Smokeless tobacco: Never Used  Substance Use Topics  . Alcohol use: No  . Drug use: No   Current Outpatient Medications  Medication Sig Dispense Refill  . albuterol (PROVENTIL HFA;VENTOLIN HFA) 108 (90 Base) MCG/ACT inhaler Inhale 1-2 puffs into the lungs every 6 (six) hours as needed for wheezing or shortness of breath. 1 Inhaler 1  . AMBULATORY NON FORMULARY MEDICATION daily as needed (take 1 gummy at night). Olly Restful Sleep    . Fluticasone-Umeclidin-Vilant (TRELEGY ELLIPTA) 100-62.5-25 MCG/INH AEPB Inhale 1 puff into the lungs daily. 30  each 12  . IBUPROFEN PO Take 4 tablets by mouth as needed.    . Probiotic Product (CULTURELLE PROBIOTICS PO) Take 2 tablets by mouth daily.    . AMBULATORY NON FORMULARY MEDICATION Incentive spirometer, use every 1-2 hours. (Patient not taking: Reported on 02/28/2019) 1 each 0  . escitalopram (LEXAPRO) 20 MG tablet escitalopram 20 mg tablet  TAKE 1/2 (ONE-HALF) TABLET BY MOUTH ONCE DAILY MAY INCREASE TO WHOLE TABLET.     No current facility-administered medications for this visit.    No Known Allergies   Review of Systems: All systems reviewed and negative except where noted in HPI.     Physical Exam:    Physical exam not completed due to the nature of this telehealth communication.  Patient was otherwise alert and oriented and well communicative.   ASSESSMENT AND PLAN;   1) Hematochezia 2) Change in bowel habits 3) Diarrhea 4) Lower abdominal pain 5) Weight loss 6) Nausea 7) Anorexia 8) Family history of CRC  48 year old female with new onset predominantly lower GI symptoms over the last 2 months, along with nausea/decreased appetite and weight loss.  Elevated ESR and platelets.  Clinical presentation suspicious for IBD, and discussed additional DDX to include malignancy. Family history notable for father with malignancy with mets to bone of unknown primary, paternal uncle with CRC and maternal uncle with CRC.  - Expedited EGD and colonoscopy with random and directed biopsies -Check CRP and fecal calprotectin -Check GI path panel -If EGD/colonoscopy and labs unrevealing, plan for cross-sectional imaging -RTC after studies are complete  The indications, risks, and benefits of EGD and colonoscopy were explained to the patient in detail. Risks include but are not limited to bleeding, perforation, adverse reaction to medications, and cardiopulmonary compromise. Sequelae include but are not limited to the possibility of surgery, hositalization, and mortality. The patient  verbalized understanding and wished to proceed. All questions answered, referred to scheduler and bowel prep ordered. Further recommendations pending results of the exam.     Lavena Bullion, DO, FACG  02/28/2019, 10:55 AM   Gregor Hams, MD

## 2019-03-07 ENCOUNTER — Other Ambulatory Visit (INDEPENDENT_AMBULATORY_CARE_PROVIDER_SITE_OTHER): Payer: BLUE CROSS/BLUE SHIELD

## 2019-03-07 ENCOUNTER — Other Ambulatory Visit: Payer: Self-pay

## 2019-03-07 DIAGNOSIS — R7989 Other specified abnormal findings of blood chemistry: Secondary | ICD-10-CM

## 2019-03-07 DIAGNOSIS — K921 Melena: Secondary | ICD-10-CM

## 2019-03-07 DIAGNOSIS — R197 Diarrhea, unspecified: Secondary | ICD-10-CM

## 2019-03-07 DIAGNOSIS — R110 Nausea: Secondary | ICD-10-CM

## 2019-03-07 DIAGNOSIS — R1030 Lower abdominal pain, unspecified: Secondary | ICD-10-CM

## 2019-03-07 DIAGNOSIS — R194 Change in bowel habit: Secondary | ICD-10-CM

## 2019-03-07 DIAGNOSIS — Z800 Family history of malignant neoplasm of digestive organs: Secondary | ICD-10-CM

## 2019-03-07 DIAGNOSIS — R634 Abnormal weight loss: Secondary | ICD-10-CM

## 2019-03-07 LAB — STOOL CULTURE
MICRO NUMBER:: 500790
MICRO NUMBER:: 500791
MICRO NUMBER:: 500792
SHIGA RESULT:: NOT DETECTED
SPECIMEN QUALITY:: ADEQUATE
SPECIMEN QUALITY:: ADEQUATE
SPECIMEN QUALITY:: ADEQUATE

## 2019-03-07 LAB — CBC WITH DIFFERENTIAL/PLATELET

## 2019-03-07 LAB — C-REACTIVE PROTEIN: CRP: <1 mg/dL (ref 0.5–20.0)

## 2019-03-07 LAB — COMPLETE METABOLIC PANEL WITH GFR

## 2019-03-07 LAB — IRON,TIBC AND FERRITIN PANEL

## 2019-03-07 LAB — SEDIMENTATION RATE

## 2019-03-16 ENCOUNTER — Other Ambulatory Visit: Payer: Self-pay

## 2019-03-16 DIAGNOSIS — K921 Melena: Secondary | ICD-10-CM

## 2019-03-16 DIAGNOSIS — Z800 Family history of malignant neoplasm of digestive organs: Secondary | ICD-10-CM

## 2019-03-16 DIAGNOSIS — R197 Diarrhea, unspecified: Secondary | ICD-10-CM

## 2019-03-16 DIAGNOSIS — R194 Change in bowel habit: Secondary | ICD-10-CM

## 2019-03-16 DIAGNOSIS — R634 Abnormal weight loss: Secondary | ICD-10-CM

## 2019-03-16 DIAGNOSIS — R1030 Lower abdominal pain, unspecified: Secondary | ICD-10-CM

## 2019-03-16 DIAGNOSIS — R110 Nausea: Secondary | ICD-10-CM

## 2019-03-16 NOTE — Progress Notes (Unsigned)
amb non

## 2019-03-19 ENCOUNTER — Encounter: Payer: Self-pay | Admitting: Gastroenterology

## 2019-03-19 ENCOUNTER — Other Ambulatory Visit: Payer: Self-pay

## 2019-03-19 ENCOUNTER — Ambulatory Visit (AMBULATORY_SURGERY_CENTER): Payer: BC Managed Care – PPO | Admitting: Gastroenterology

## 2019-03-19 VITALS — BP 112/66 | HR 65 | Temp 98.6°F | Resp 15 | Ht 63.0 in | Wt 165.0 lb

## 2019-03-19 DIAGNOSIS — K640 First degree hemorrhoids: Secondary | ICD-10-CM

## 2019-03-19 DIAGNOSIS — R1084 Generalized abdominal pain: Secondary | ICD-10-CM | POA: Diagnosis not present

## 2019-03-19 DIAGNOSIS — D122 Benign neoplasm of ascending colon: Secondary | ICD-10-CM

## 2019-03-19 DIAGNOSIS — K921 Melena: Secondary | ICD-10-CM

## 2019-03-19 DIAGNOSIS — K635 Polyp of colon: Secondary | ICD-10-CM

## 2019-03-19 DIAGNOSIS — D125 Benign neoplasm of sigmoid colon: Secondary | ICD-10-CM

## 2019-03-19 DIAGNOSIS — R634 Abnormal weight loss: Secondary | ICD-10-CM | POA: Diagnosis not present

## 2019-03-19 DIAGNOSIS — R110 Nausea: Secondary | ICD-10-CM

## 2019-03-19 MED ORDER — SODIUM CHLORIDE 0.9 % IV SOLN: 500.0000 mL | Freq: Once | INTRAVENOUS | Status: AC

## 2019-03-19 NOTE — Progress Notes (Signed)
Report to PACU, RN, vss, BBS= Clear.  

## 2019-03-19 NOTE — Op Note (Signed)
[facility] Patient Name: Danielle Marsh Procedure Date: 03/19/2019 1:39 PM MRN: [id number] Endoscopist: Gerrit Heck , MD Age: 48 Referring MD:  Date of Birth: December 08, 1970 Gender: Female Account #: [id number] Procedure:                Colonoscopy Indications:              Generalized abdominal pain, Lower abdominal pain,                            Hematochezia, Change in bowel habits, Diarrhea,                            Weight loss Medicines:                Monitored Anesthesia Care Procedure:                Pre-Anesthesia Assessment:                           - Prior to the procedure, a History and Physical                            was performed, and patient medications and                            allergies were reviewed. The patient's tolerance of                            previous anesthesia was also reviewed. The risks                            and benefits of the procedure and the sedation                            options and risks were discussed with the patient.                            All questions were answered, and informed consent                            was obtained. Prior Anticoagulants: The patient has                            taken no previous anticoagulant or antiplatelet                            agents. ASA Grade Assessment: II - A patient with                            mild systemic disease. After reviewing the risks                            and benefits, the patient was deemed in  satisfactory condition to undergo the procedure.                           After obtaining informed consent, the colonoscope                            was passed under direct vision. Throughout the                            procedure, the patient's blood pressure, pulse, and                            oxygen saturations were monitored continuously. The                            Colonoscope was introduced through the anus and                           advanced to the the terminal ileum. The colonoscopy                            was performed without difficulty. The patient                            tolerated the procedure well. The quality of the                            bowel preparation was fair. The terminal ileum,                            ileocecal valve, appendiceal orifice, and rectum                            were photographed. Scope In: 1:55:01 PM Scope Out: 2:16:46 PM Scope Withdrawal Time: 0 hours 13 minutes 57 seconds  Total Procedure Duration: 0 hours 21 minutes 45 seconds  Findings:                 The perianal and digital rectal examinations were                            normal.                           A 6 mm polyp was found in the ascending colon. The                            polyp was sessile. The polyp was removed with a                            cold snare. Resection and retrieval were complete.                            Estimated blood loss was minimal.  A 4 mm polyp was found in the sigmoid colon. The                            polyp was sessile. The polyp was removed with a                            cold snare. Resection and retrieval were complete.                            Estimated blood loss was minimal.                           A moderate amount of semi-liquid stool was found                            scattered throughout the entire colon, interfering                            with visualization. Lavage of the area was                            performed using copious amounts of sterile water,                            resulting in clearance with fair visualization.                           Segmental inflammation characterized by congestion                            (edema) and loss of vascularity was found in the                            sigmoid colon, in the transverse colon and in the                            ascending colon, in a  patchy distribution. No                            mucosal erythema, erosions, or ulceration noted.                            Biopsies were taken with a cold forceps for                            histology and for evaluation of microscopic                            colitis. Estimated blood loss was minimal.                           Non-bleeding internal hemorrhoids were found during  retroflexion. The hemorrhoids were small.                           The terminal ileum appeared normal. Complications:            No immediate complications. Estimated Blood Loss:     Estimated blood loss was minimal. Impression:               - Preparation of the colon was fair.                           - One 6 mm polyp in the ascending colon, removed                            with a cold snare. Resected and retrieved.                           - One 4 mm polyp in the sigmoid colon, removed with                            a cold snare. Resected and retrieved.                           - Stool in the entire examined colon.                           - Segmental inflammation was found in the sigmoid                            colon, in the transverse colon and in the ascending                            colon. Biopsied.                           - Non-bleeding internal hemorrhoids.                           - The examined portion of the ileum was normal. Recommendation:           - Patient has a contact number available for                            emergencies. The signs and symptoms of potential                            delayed complications were discussed with the                            patient. Return to normal activities tomorrow.                            Written discharge instructions were provided to the  patient.                           - Resume previous diet.                           - Continue present medications.                            - Await pathology results.                           - Repeat colonoscopy at age 26 for screening                            purposes and because the bowel preparation was                            suboptimal.                           - Return to GI clinic at appointment to be                            scheduled.                           - Pending biopsy results, if unrevealing, will plan                            for CT abdomen/pelvis for further evaluation. Gerrit Heck, MD 03/19/2019 2:33:18 PM

## 2019-03-19 NOTE — Op Note (Signed)
[facility] Patient Name: Danielle Marsh Procedure Date: 03/19/2019 1:40 PM MRN: [id number] Endoscopist: Gerrit Heck , MD Age: 48 Referring MD:  Date of Birth: 03/21/1971 Gender: Female Account #: [id number] Procedure:                Upper GI endoscopy Indications:              Generalized abdominal pain, , Anorexia, Diarrhea,                            Nausea, Weight lossDecreased appetite/Change in                            bowel habits Medicines:                Monitored Anesthesia Care Procedure:                Pre-Anesthesia Assessment:                           - Prior to the procedure, a History and Physical                            was performed, and patient medications and                            allergies were reviewed. The patient's tolerance of                            previous anesthesia was also reviewed. The risks                            and benefits of the procedure and the sedation                            options and risks were discussed with the patient.                            All questions were answered, and informed consent                            was obtained. Prior Anticoagulants: The patient has                            taken no previous anticoagulant or antiplatelet                            agents. ASA Grade Assessment: II - A patient with                            mild systemic disease. After reviewing the risks                            and benefits, the patient was deemed in  satisfactory condition to undergo the procedure.                           After obtaining informed consent, the endoscope was                            passed under direct vision. Throughout the                            procedure, the patient's blood pressure, pulse, and                            oxygen saturations were monitored continuously. The                            Endoscope was introduced through the mouth,  and                            advanced to the second part of duodenum. The upper                            GI endoscopy was accomplished without difficulty.                            The patient tolerated the procedure well. Scope In: Scope Out: Findings:                 The examined esophagus was normal.                           The entire examined stomach was normal. Biopsies                            were taken with a cold forceps for Helicobacter                            pylori testing. Estimated blood loss was minimal.                           The duodenal bulb, first portion of the duodenum                            and second portion of the duodenum were normal.                            Biopsies for histology were taken with a cold                            forceps for evaluation of celiac disease. Estimated                            blood loss was minimal. Complications:            No immediate complications. Estimated Blood Loss:     Estimated blood loss was minimal.  Impression:               - Normal esophagus.                           - Normal stomach. Biopsied.                           - Normal duodenal bulb, first portion of the                            duodenum and second portion of the duodenum.                            Biopsied. Recommendation:           - Patient has a contact number available for                            emergencies. The signs and symptoms of potential                            delayed complications were discussed with the                            patient. Return to normal activities tomorrow.                            Written discharge instructions were provided to the                            patient.                           - Resume previous diet.                           - Continue present medications.                           - Await pathology results.                           - Perform a colonoscopy today.                            - Return to GI clinic at appointment to be                            scheduled. Gerrit Heck, MD 03/19/2019 2:24:48 PM

## 2019-03-19 NOTE — Patient Instructions (Addendum)
YOU HAD AN ENDOSCOPIC PROCEDURE TODAY AT [facility]:   Refer to the procedure report that was given to you for any specific questions about what was found during the examination.  If the procedure report does not answer your questions, please call your gastroenterologist to clarify.  If you requested that your care partner not be given the details of your procedure findings, then the procedure report has been included in a sealed envelope for you to review at your convenience later.  YOU SHOULD EXPECT: Some feelings of bloating in the abdomen. Passage of more gas than usual.  Walking can help get rid of the air that was put into your GI tract during the procedure and reduce the bloating. If you had a lower endoscopy (such as a colonoscopy or flexible sigmoidoscopy) you may notice spotting of blood in your stool or on the toilet paper. If you underwent a bowel prep for your procedure, you may not have a normal bowel movement for a few days.  Please Note:  You might notice some irritation and congestion in your nose or some drainage.  This is from the oxygen used during your procedure.  There is no need for concern and it should clear up in a day or so.  SYMPTOMS TO REPORT IMMEDIATELY:   Following lower endoscopy (colonoscopy or flexible sigmoidoscopy):  Excessive amounts of blood in the stool  Significant tenderness or worsening of abdominal pains  Swelling of the abdomen that is new, acute  Fever of 100F or higher   Following upper endoscopy (EGD)  Vomiting of blood or coffee ground material  New chest pain or pain under the shoulder blades  Painful or persistently difficult swallowing  New shortness of breath  Fever of 100F or higher  Black, tarry-looking stools  For urgent or emergent issues, a gastroenterologist can be reached at any hour by calling [phone].   DIET:  We do recommend a small meal at first, but then you may proceed to your regular diet.  Drink  plenty of fluids but you should avoid alcoholic beverages for 24 hours.  ACTIVITY:  You should plan to take it easy for the rest of today and you should NOT DRIVE or use heavy machinery until tomorrow (because of the sedation medicines used during the test).    FOLLOW UP: Our staff will call the number listed on your records 48-72 hours following your procedure to check on you and address any questions or concerns that you may have regarding the information given to you following your procedure. If we do not reach you, we will leave a message.  We will attempt to reach you two times.  During this call, we will ask if you have developed any symptoms of COVID 19. If you develop any symptoms (ie: fever, flu-like symptoms, shortness of breath, cough etc.) before then, please call [phone].  If you test positive for Covid 19 in the 2 weeks post procedure, please call and report this information to [place].    If any biopsies were taken you will be contacted by phone or by letter within the next 1-3 weeks.  Please call us at [phone] if you have not heard about the biopsies in 3 weeks.   Follow up appointment to be schedule by office Await for biopsy results Polyps (handout given) Repeat Colonoscopy at age 3 Pending biopsy results will plan for CT scan abdomen / pelvis  For further evaluations Follow up appointment for Hemorrhoid Banding  on March 26, 2019 at 11:00 needs to arrive 10:45 Amelia and/or your care partner have signed paperwork which will be entered into your electronic medical record.  These signatures attest to the fact that that the information above on your After Visit Summary has been reviewed and is understood.  Full responsibility of the confidentiality of this discharge information lies with you and/or your care-partner.

## 2019-03-21 ENCOUNTER — Telehealth: Payer: Self-pay

## 2019-03-21 ENCOUNTER — Telehealth: Payer: Self-pay | Admitting: Gastroenterology

## 2019-03-21 ENCOUNTER — Telehealth: Payer: Self-pay | Admitting: *Deleted

## 2019-03-21 NOTE — Telephone Encounter (Signed)
  Follow up Call-  Call back number 03/19/2019  Post procedure Call Back phone  # [phone]  Permission to leave phone message Yes  Some recent data might be hidden     Patient questions:  Do you have a fever, pain , or abdominal swelling? No. Pain Score  0 *  Have you tolerated food without any problems? Yes.    Have you been able to return to your normal activities? No.  Do you have any questions about your discharge instructions: Diet   No. Medications  No. Follow up visit  No.  Do you have questions or concerns about your Care? No.  Actions: * If pain score is 4 or above: No action needed, pain <4.   1. Have you developed a fever since your procedure? NO  2.   Have you had an respiratory symptoms (SOB or cough) since your procedure? NO  3.   Have you tested positive for COVID 19 since your procedure NO  4.   Have you had any family members/close contacts diagnosed with the COVID 19 since your procedure?  NO   If yes to any of these questions please route to Joylene John, RN and Alphonsa Gin, RN.

## 2019-03-21 NOTE — Telephone Encounter (Signed)
  Follow up Call-  Call back number 03/19/2019  Post procedure Call Back phone  # [phone]  Permission to leave phone message Yes  Some recent data might be hidden     No ID on voicemail Will try again midday

## 2019-03-21 NOTE — Telephone Encounter (Signed)
Patient is returning your call.  

## 2019-03-23 ENCOUNTER — Encounter: Payer: Self-pay | Admitting: Gastroenterology

## 2019-03-26 ENCOUNTER — Encounter: Payer: BC Managed Care – PPO | Admitting: Gastroenterology

## 2019-04-11 ENCOUNTER — Encounter: Payer: Self-pay | Admitting: Physician Assistant

## 2019-04-11 DIAGNOSIS — D126 Benign neoplasm of colon, unspecified: Secondary | ICD-10-CM | POA: Insufficient documentation

## 2019-05-16 DIAGNOSIS — Z6829 Body mass index (BMI) 29.0-29.9, adult: Secondary | ICD-10-CM | POA: Diagnosis not present

## 2019-05-16 DIAGNOSIS — Z0000 Encounter for general adult medical examination without abnormal findings: Secondary | ICD-10-CM | POA: Diagnosis not present

## 2019-05-16 DIAGNOSIS — Z01419 Encounter for gynecological examination (general) (routine) without abnormal findings: Secondary | ICD-10-CM | POA: Diagnosis not present

## 2019-07-19 DIAGNOSIS — J449 Chronic obstructive pulmonary disease, unspecified: Secondary | ICD-10-CM | POA: Diagnosis not present

## 2019-07-19 DIAGNOSIS — I498 Other specified cardiac arrhythmias: Secondary | ICD-10-CM | POA: Diagnosis not present

## 2019-07-19 DIAGNOSIS — I10 Essential (primary) hypertension: Secondary | ICD-10-CM | POA: Diagnosis not present

## 2019-07-19 DIAGNOSIS — R0789 Other chest pain: Secondary | ICD-10-CM | POA: Diagnosis not present

## 2019-07-19 DIAGNOSIS — F17200 Nicotine dependence, unspecified, uncomplicated: Secondary | ICD-10-CM | POA: Diagnosis not present

## 2019-07-19 DIAGNOSIS — Z7951 Long term (current) use of inhaled steroids: Secondary | ICD-10-CM | POA: Diagnosis not present

## 2019-07-19 DIAGNOSIS — Z79899 Other long term (current) drug therapy: Secondary | ICD-10-CM | POA: Diagnosis not present

## 2019-07-26 ENCOUNTER — Ambulatory Visit (INDEPENDENT_AMBULATORY_CARE_PROVIDER_SITE_OTHER): Payer: BC Managed Care – PPO | Admitting: Family Medicine

## 2019-07-26 ENCOUNTER — Encounter: Payer: Self-pay | Admitting: Family Medicine

## 2019-07-26 ENCOUNTER — Other Ambulatory Visit: Payer: Self-pay

## 2019-07-26 VITALS — BP 116/79 | HR 77 | Temp 98.0°F | Wt 156.0 lb

## 2019-07-26 DIAGNOSIS — Z23 Encounter for immunization: Secondary | ICD-10-CM

## 2019-07-26 DIAGNOSIS — M7989 Other specified soft tissue disorders: Secondary | ICD-10-CM | POA: Diagnosis not present

## 2019-07-26 DIAGNOSIS — E875 Hyperkalemia: Secondary | ICD-10-CM | POA: Diagnosis not present

## 2019-07-26 DIAGNOSIS — R253 Fasciculation: Secondary | ICD-10-CM | POA: Diagnosis not present

## 2019-07-26 DIAGNOSIS — L539 Erythematous condition, unspecified: Secondary | ICD-10-CM

## 2019-07-26 MED ORDER — DICLOFENAC SODIUM 1 % TD GEL: 4.0000 g | Freq: Four times a day (QID) | TRANSDERMAL | 11 refills | Status: AC

## 2019-07-26 NOTE — Progress Notes (Signed)
Danielle Marsh is a 48 y.o. female who presents to [facility] today for right arm redness and swelling.  Patient was seen in the emergency room on October 8 for chest pain.  She had extensive work-up and evaluation including cycle troponins and EKG that were effectively normal.  She notes that they asked her to schedule for an outpatient stress test which is scheduled for next week.  Overwhelmed the emergency room she had a very painful experience of a blood pressure cuff on her right upper arm.  She notes the blood pressure automatic machine Squeezing her arm multiple times.  This was painful and since she is left the hospital she notes redness and pain in her upper extremity with a blood pressure cuff was.  She also notes that her third digit of her right hand is twitching at times.  She denies pain radiating below the level of her elbow weakness or numbness into her hand.  She denies any fevers or chills.  She and her family are worried that she may have some serious muscle injury or nerve injury or potentially a blood clot in her upper extremity.  She denies significant chest pain or palpitations currently.  No fevers or chills.  She has not tried much treatment for this yet.   ROS as above:  Exam:  BP 116/79   Pulse 77   Temp 98 F (36.7 C) (Oral)   Wt 156 lb (70.8 kg)   BMI 27.63 kg/m  Wt Readings from Last 5 Encounters:  07/26/19 156 lb (70.8 kg)  03/19/19 165 lb (74.8 kg)  02/28/19 165 lb (74.8 kg)  02/27/19 155 lb (70.3 kg)  12/27/18 157 lb (71.2 kg)    Gen: Well NAD HEENT: EOMI,  MMM Lungs: Normal work of breathing. CTABL Heart: RRR no MRG Abd: NABS, Soft. Nondistended, Nontender Exts: Brisk capillary refill, warm and well perfused.  Right arm: Slight skin erythema right upper extremity macular. Right upper arm mildly tender to palpation.  Normal  shoulder and arm motion. Upper extremity strength reflexes and sensation are equal normal throughout. Pulses cap refill and sensation are intact into the distal upper extremity. Strength intact in the hand and wrist.  Lab and Radiology Results    Assessment and Plan: 48 y.o. female with  Right arm pain and muscle twitching following blood pressure cuff. It is likely that she has a little bit of skin irritation or muscle irritation following her blood pressure cuff episode in the hospital.  Severe injury is less likely however will complete work-up.  Will repeat metabolic panel as in the hospital her potassium was a little bit elevated.  Also will check magnesium and will check CK for signs of muscle inflammation.  Additionally will arrange for duplex ultrasound right upper extremity to rule out DVT.  For symptom management now will have a trial of diclofenac gel which may help some and likely will not harm.  Influenza vaccine given today as well.  Recheck back with PCP.  PDMP not reviewed this encounter. Orders Placed This Encounter  Procedures  . US Venous Img Upper Uni Right    Standing Status:   Future    Standing Expiration Date:   09/24/2020    Order Specific Question:   Reason for Exam (SYMPTOM  OR DIAGNOSIS REQUIRED)    Answer:   arm swelling nad pain after BP cuff too tight Eval possible UE DVT  Order Specific Question:   Preferred imaging location?    Answer:   [facility]  . Flu Vaccine QUAD 6+ mos PF IM (Fluarix Quad PF)  . COMPLETE METABOLIC PANEL WITH GFR  . CK  . Magnesium   Meds ordered this encounter  Medications  . diclofenac sodium (VOLTAREN) 1 % GEL    Sig: Apply 4 g topically 4 (four) times daily. To affected joint.    Dispense:  100 g    Refill:  11     Historical information moved to improve visibility of documentation.  Past Medical History:  Diagnosis Date  . Asthma   . COPD (chronic obstructive pulmonary disease) ([place]) 12/27/2018    Past Surgical History:  Procedure Laterality Date  . CHOLECYSTECTOMY    . COLONOSCOPY     in patient's 20's in [place]  . TUBAL LIGATION Bilateral    Social History   Tobacco Use  . Smoking status: Current Every Day Smoker  . Smokeless tobacco: Never Used  Substance Use Topics  . Alcohol use: No   family history includes Bone cancer in her father; Breast cancer in her maternal grandmother and paternal grandmother; Cancer in her maternal grandmother, paternal grandmother, and sister; Cancer - Other in her father; Colon cancer in her maternal aunt, maternal aunt, and maternal uncle; Diabetes in her brother, mother, and sister; Hypertension in her brother, mother, and sister; Lung cancer in her maternal aunt; Stroke in her mother.  Medications: Current Outpatient Medications  Medication Sig Dispense Refill  . albuterol (PROVENTIL HFA;VENTOLIN HFA) 108 (90 Base) MCG/ACT inhaler Inhale 1-2 puffs into the lungs every 6 (six) hours as needed for wheezing or shortness of breath. 1 Inhaler 1  . AMBULATORY NON FORMULARY MEDICATION Incentive spirometer, use every 1-2 hours. 1 each 0  . AMBULATORY NON FORMULARY MEDICATION daily as needed (take 1 gummy at night). Olly Restful Sleep    . escitalopram (LEXAPRO) 20 MG tablet escitalopram 20 mg tablet  TAKE 1/2 (ONE-HALF) TABLET BY MOUTH ONCE DAILY MAY INCREASE TO WHOLE TABLET.    Marland Kitchen Fluticasone-Umeclidin-Vilant (TRELEGY ELLIPTA) 100-62.5-25 MCG/INH AEPB Inhale 1 puff into the lungs daily. 30 each 12  . IBUPROFEN PO Take 4 tablets by mouth as needed.    . Probiotic Product (CULTURELLE PROBIOTICS PO) Take 2 tablets by mouth daily.    . diclofenac sodium (VOLTAREN) 1 % GEL Apply 4 g topically 4 (four) times daily. To affected joint. 100 g 11   Current Facility-Administered Medications  Medication Dose Route Frequency Provider Last Rate Last Dose  . 0.9 %  sodium chloride infusion  500 mL Intravenous Once Cirigliano, Vito V, DO       No Known  Allergies   Discussed warning signs or symptoms. Please see discharge instructions. Patient expresses understanding.

## 2019-07-26 NOTE — Patient Instructions (Signed)
Thank you for coming in today. Get labs and ultrasound today or tomorrow.  Use the gel for arm pain.  Follow up with [facility] in the near future to evaluate and discussed the non urgent issues.

## 2019-07-27 LAB — CK: Total CK: 86 U/L (ref 29–143)

## 2019-07-27 LAB — COMPLETE METABOLIC PANEL WITH GFR
AG Ratio: 1.7 (calc) (ref 1.0–2.5)
ALT: 17 U/L (ref 6–29)
AST: 12 U/L (ref 10–35)
Albumin: 4.5 g/dL (ref 3.6–5.1)
Alkaline phosphatase (APISO): 103 U/L (ref 31–125)
BUN: 15 mg/dL (ref 7–25)
CO2: 28 mmol/L (ref 20–32)
Calcium: 9.8 mg/dL (ref 8.6–10.2)
Chloride: 104 mmol/L (ref 98–110)
Creat: 0.76 mg/dL (ref 0.50–1.10)
GFR, Est African American: 108 mL/min/{1.73_m2} (ref >=60)
GFR, Est Non African American: 93 mL/min/{1.73_m2} (ref >=60)
Globulin: 2.7 g/dL (calc) (ref 1.9–3.7)
Glucose, Bld: 90 mg/dL (ref 65–99)
Potassium: 4.4 mmol/L (ref 3.5–5.3)
Sodium: 139 mmol/L (ref 135–146)
Total Bilirubin: 0.4 mg/dL (ref 0.2–1.2)
Total Protein: 7.2 g/dL (ref 6.1–8.1)

## 2019-07-27 LAB — MAGNESIUM: Magnesium: 2.2 mg/dL (ref 1.5–2.5)

## 2019-07-30 ENCOUNTER — Telehealth: Payer: Self-pay | Admitting: Family Medicine

## 2019-07-30 NOTE — Telephone Encounter (Signed)
Ordered upper extremity ultrasound to rule out DVT.  Patient has been contacted several times by radiology to schedule and having trouble getting to the patient.  Please also try calling and sending letter.

## 2019-07-30 NOTE — Telephone Encounter (Signed)
Attempted to call patient and also spouse. No answer and no option to leave VM. Letter mailed to patient and also sent via [place]

## 2019-07-31 NOTE — Telephone Encounter (Signed)
Was able to contact patient today. She was transferred to imaging department to schedule and patient declined stating she felt better and did not want scan.

## 2020-07-30 DIAGNOSIS — Z01419 Encounter for gynecological examination (general) (routine) without abnormal findings: Secondary | ICD-10-CM | POA: Diagnosis not present

## 2020-07-30 DIAGNOSIS — R87619 Unspecified abnormal cytological findings in specimens from cervix uteri: Secondary | ICD-10-CM | POA: Diagnosis not present

## 2020-07-30 DIAGNOSIS — Z6829 Body mass index (BMI) 29.0-29.9, adult: Secondary | ICD-10-CM | POA: Diagnosis not present

## 2020-07-30 LAB — HM PAP SMEAR: HM Pap smear: NEGATIVE

## 2020-11-12 ENCOUNTER — Ambulatory Visit: Payer: BC Managed Care – PPO | Admitting: Sports Medicine

## 2022-02-17 ENCOUNTER — Ambulatory Visit: Payer: 59 | Admitting: Physician Assistant

## 2022-02-17 ENCOUNTER — Ambulatory Visit (INDEPENDENT_AMBULATORY_CARE_PROVIDER_SITE_OTHER): Payer: 59

## 2022-02-17 ENCOUNTER — Encounter: Payer: Self-pay | Admitting: Physician Assistant

## 2022-02-17 VITALS — BP 140/85 | HR 85 | Resp 20 | Ht 63.0 in | Wt 169.1 lb

## 2022-02-17 DIAGNOSIS — M545 Low back pain, unspecified: Secondary | ICD-10-CM

## 2022-02-17 DIAGNOSIS — G8929 Other chronic pain: Secondary | ICD-10-CM | POA: Diagnosis not present

## 2022-02-17 DIAGNOSIS — M549 Dorsalgia, unspecified: Secondary | ICD-10-CM

## 2022-02-17 DIAGNOSIS — M7062 Trochanteric bursitis, left hip: Secondary | ICD-10-CM | POA: Diagnosis not present

## 2022-02-17 DIAGNOSIS — R5383 Other fatigue: Secondary | ICD-10-CM | POA: Diagnosis not present

## 2022-02-17 DIAGNOSIS — R079 Chest pain, unspecified: Secondary | ICD-10-CM | POA: Diagnosis not present

## 2022-02-17 DIAGNOSIS — W109XXA Fall (on) (from) unspecified stairs and steps, initial encounter: Secondary | ICD-10-CM

## 2022-02-17 MED ORDER — MELOXICAM 15 MG PO TABS: 15.0000 mg | ORAL_TABLET | Freq: Every day | ORAL | 1 refills | Status: AC

## 2022-02-17 NOTE — Progress Notes (Signed)
? ?Acute Office Visit ? ?Subjective:  ? ?  ?Patient ID: Danielle Marsh, female    DOB: 12-03-1970, 51 y.o.   MRN: [id number] ? ?Chief Complaint  ?Patient presents with  ? Chest Pain  ? LEFT EAR PAIN  ? Back Pain  ? Hip Pain  ? ? ?HPI ?Patient is in today for multiple concerns. She is having left hip pain, mid and lower back pain, and chest pain.   ? ?Hip pain has been progressively getting worse. It does radiate down lateral leg.  Hurts to go up stairs, walk, stand. Not tried anything to make better. Never had anything like this before.  ? ?Pain is having pain in mid and lower back with "spasms". No sciatica. No bowel or bladder dysfunction. No leg weakness. She states it feels like a knife in her mid back at times.  ? ?Pt complains of some chest tightness and pain intermittently. Not with exertion. Not tried anything to make better. Nothing makes worse.  ? ?Pt co being exteremly tired worsening over the past  month or so. She feels like she cannot keep up right now. She wants to have more energy.  ? ?.. ?Active Ambulatory Problems  ?  Diagnosis Date Noted  ? THROMBOCYTOSIS 10/14/2008  ? EAR PAIN, RIGHT 10/14/2008  ? EXTERNAL HEMORRHOIDS 10/18/2008  ? Slow transit constipation 10/18/2008  ? LEG PAIN, BILATERAL 10/14/2008  ? Weakness 10/30/2014  ? Loss of weight 10/30/2014  ? Hair loss 10/30/2014  ? Vision changes 10/30/2014  ? Skin lesion of scalp 10/30/2014  ? Hot flashes 10/30/2014  ? Hematuria 10/30/2014  ? RLQ abdominal pain 10/30/2014  ? Muscle cramps 10/30/2014  ? Generalized anxiety disorder 10/30/2014  ? Dense breast tissue 08/21/2018  ? 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 Elevated alkaline phosphatase level 08/22/2018  ? Elevated platelet count 08/22/2018  ? COPD (chronic obstructive pulmonary disease) ([place]) 12/27/2018  ? Asthma 12/27/2018  ? Right maxillary sinusitis 01/11/2019  ? Tubular adenoma of colon 04/11/2019  ? 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 Trochanteric bursitis of left hip  02/23/2022  ? Chronic left-sided low back pain without sciatica 02/23/2022  ? Mid back pain 02/23/2022  ? No energy 02/23/2022  ? Chest pain 02/23/2022  ? ?Resolved Ambulatory Problems  ?  Diagnosis Date Noted  ? RUQ pain 10/30/2014  ? Breast pain, left 08/21/2018  ? ?No Additional Past Medical History  ? ? ? ?ROS ? ?See HPI.  ?   ?Objective:  ?  ?BP 140/85 (BP Location: Left Arm, Cuff Size: Normal)   Pulse 85   Resp 20   Ht '5\' 3"'$  (1.6 m)   Wt 169 lb 1.9 oz (76.7 kg)   SpO2 98%   BMI 29.96 kg/m?  ?BP Readings from Last 3 Encounters:  ?02/17/22 140/85  ?07/26/19 116/79  ?03/19/19 112/66  ? ?  ? ?Physical Exam ?Vitals reviewed.  ?Constitutional:   ?   Appearance: She is well-developed. She is obese.  ?HENT:  ?   Head: Normocephalic.  ?Eyes:  ?   Pupils: Pupils are equal, round, and reactive to light.  ?Cardiovascular:  ?   Rate and Rhythm: Normal rate and regular rhythm.  ?   Pulses:     ?     Carotid pulses are 0 on the right side and 0 on the left side. ?     Radial pulses are 0 on the right side and 0 on the left side.  ?  Heart sounds: Normal heart sounds.  ?Pulmonary:  ?   Effort: Pulmonary effort is normal.  ?   Breath sounds: Normal breath sounds.  ?Chest:  ?   Chest wall: No mass, deformity, tenderness, crepitus or edema. There is no dullness to percussion.  ?Abdominal:  ?   Palpations: Abdomen is soft.  ?   Tenderness: There is no guarding.  ?Musculoskeletal:  ?   Cervical back: Normal range of motion and neck supple.  ?   Right lower leg: No edema.  ?   Left lower leg: No edema.  ?   Comments: Tenderness over left greater trochanter ?5/5 lower ext strength ?Negative straight leg raise, bilaterally ?No tenderness over cervical/thoracic/lumbar spine to palpation ?Tightness over paraspinal muscles  ?Neurological:  ?   General: No focal deficit present.  ?   Mental Status: She is alert and oriented to person, place, and time.  ?Psychiatric:     ?   Mood and Affect: Mood normal.  ? ? ? ?EKG-NSR, no acute  changes.  ?   ?Assessment & Plan:  ?..Fabiola was seen today for chest pain, left ear pain, back pain and hip pain. ? ?Diagnoses and all orders for this visit: ? ?Trochanteric bursitis of left hip ?-     meloxicam (MOBIC) 15 MG tablet; Take 1 tablet (15 mg total) by mouth daily. ? ?No energy ?-     COMPLETE METABOLIC PANEL WITH GFR ?-     VITAMIN D 25 Hydroxy (Vit-D Deficiency, Fractures) ?-     B12 and Folate Panel ?-     TSH ?-     CBC w/Diff/Platelet ?-     Fe+TIBC+Fer ? ?Mid back pain ?-     DG Thoracic Spine W/Swimmers; Future ?-     meloxicam (MOBIC) 15 MG tablet; Take 1 tablet (15 mg total) by mouth daily. ? ?Chronic left-sided low back pain without sciatica ?-     DG Lumbar Spine Complete; Future ?-     meloxicam (MOBIC) 15 MG tablet; Take 1 tablet (15 mg total) by mouth daily. ? ?Chest pain, unspecified type ?-     EKG 12-Lead ? ?EKG no concerns ?Labs ordered ? ?Hip bursa injection done today ?Xrays ordered ?Start mobic ?HO with exercises given ?Follow up in 2 weeks ? ?Mid and low back pain  ?Suspect DDD ?Exercises/NSAIDs/Tens units/icy hot patches discussed ? ?Look for lab reasons for fatigue.  ? ? ?Return in about 2 weeks (around 03/03/2022) for Follow up. ? ?Iran Planas, PA-C ? ? ?

## 2022-02-17 NOTE — Patient Instructions (Signed)
Hip Bursitis Rehab Ask your health care provider which exercises are safe for you. Do exercises exactly as told by your health care provider and adjust them as directed. It is normal to feel mild stretching, pulling, tightness, or discomfort as you do these exercises. Stop right away if you feel sudden pain or your pain gets worse. Do not begin these exercises until told by your health care provider. Stretching exercise This exercise warms up your muscles and joints and improves the movement and flexibility of your hip. This exercise also helps to relieve pain and stiffness. Iliotibial band stretch An iliotibial band is a strong band of muscle tissue that runs from the outer side of your hip to the outer side of your thigh and knee. Lie on your side with your left / right leg in the top position. Bend your left / right knee and grab your ankle. Stretch out your bottom arm to help you balance. Slowly bring your knee back so your thigh is slightly behind your body. Slowly lower your knee toward the floor until you feel a gentle stretch on the outside of your left / right thigh. If you do not feel a stretch and your knee will not lower more toward the floor, place the heel of your other foot on top of your knee and pull your knee down toward the floor with your foot. Hold this position for __________ seconds. Slowly return to the starting position. Repeat __________ times. Complete this exercise __________ times a day. Strengthening exercises These exercises build strength and endurance in your hip and pelvis. Endurance is the ability to use your muscles for a long time, even after they get tired. Bridge This exercise strengthens the muscles that move your thigh backward (hip extensors). Lie on your back on a firm surface with your knees bent and your feet flat on the floor. Tighten your buttocks muscles and lift your buttocks off the floor until your trunk is level with your thighs. Do not arch your  back. You should feel the muscles working in your buttocks and the back of your thighs. If you do not feel these muscles, slide your feet 1-2 inches (2.5-5 cm) farther away from your buttocks. If this exercise is too easy, try doing it with your arms crossed over your chest. Hold this position for __________ seconds. Slowly lower your hips to the starting position. Let your muscles relax completely after each repetition. Repeat __________ times. Complete this exercise __________ times a day. Squats This exercise strengthens the muscles in front of your thigh and knee (quadriceps). Stand in front of a table, with your feet and knees pointing straight ahead. You may rest your hands on the table for balance but not for support. Slowly bend your knees and lower your hips like you are going to sit in a chair. Keep your weight over your heels, not over your toes. Keep your lower legs upright so they are parallel with the table legs. Do not let your hips go lower than your knees. Do not bend lower than told by your health care provider. If your hip pain increases, do not bend as low. Hold the squat position for __________ seconds. Slowly push with your legs to return to standing. Do not use your hands to pull yourself to standing. Repeat __________ times. Complete this exercise __________ times a day. Hip hike  Stand sideways on a bottom step. Stand on your left / right leg with your other foot unsupported next to   the step. You can hold on to the railing or wall for balance if needed. Keep your knees straight and your torso square. Then lift your left / right hip up toward the ceiling. Hold this position for __________ seconds. Slowly let your left / right hip lower toward the floor, past the starting position. Your foot should get closer to the floor. Do not lean or bend your knees. Repeat __________ times. Complete this exercise __________ times a day. Single leg stand This exercise increases  your balance. Without shoes, stand near a railing or in a doorway. You may hold on to the railing or door frame as needed for balance. Squeeze your left / right buttock muscles, then lift up your other foot. Do not let your left / right hip push out to the side. It is helpful to stand in front of a mirror for this exercise so you can watch your hip. Hold this position for __________ seconds. Repeat __________ times. Complete this exercise __________ times a day. This information is not intended to replace advice given to you by your health care provider. Make sure you discuss any questions you have with your health care provider. Document Revised: 09/09/2021 Document Reviewed: 09/09/2021 Elsevier Patient Education  2023 [employer]  

## 2022-02-18 LAB — CBC WITH DIFFERENTIAL/PLATELET
Absolute Monocytes: 792 cells/uL (ref 200–950)
Basophils Absolute: 50 cells/uL (ref 0–200)
Basophils Relative: 0.5 %
Eosinophils Absolute: 99 cells/uL (ref 15–500)
Eosinophils Relative: 1 %
HCT: 39.3 % (ref 35.0–45.0)
Hemoglobin: 13.1 g/dL (ref 11.7–15.5)
Lymphs Abs: 3010 cells/uL (ref 850–3900)
MCH: 30.5 pg (ref 27.0–33.0)
MCHC: 33.3 g/dL (ref 32.0–36.0)
MCV: 91.6 fL (ref 80.0–100.0)
MPV: 8.7 fL (ref 7.5–12.5)
Monocytes Relative: 8 %
Neutro Abs: 5950 cells/uL (ref 1500–7800)
Neutrophils Relative %: 60.1 %
Platelets: 594 10*3/uL — ABNORMAL HIGH (ref 140–400)
RBC: 4.29 10*6/uL (ref 3.80–5.10)
RDW: 12.1 % (ref 11.0–15.0)
Total Lymphocyte: 30.4 %
WBC: 9.9 10*3/uL (ref 3.8–10.8)

## 2022-02-18 LAB — COMPLETE METABOLIC PANEL WITH GFR
AG Ratio: 1.8 (calc) (ref 1.0–2.5)
ALT: 13 U/L (ref 6–29)
AST: 13 U/L (ref 10–35)
Albumin: 4.4 g/dL (ref 3.6–5.1)
Alkaline phosphatase (APISO): 97 U/L (ref 37–153)
BUN: 16 mg/dL (ref 7–25)
CO2: 25 mmol/L (ref 20–32)
Calcium: 9.8 mg/dL (ref 8.6–10.4)
Chloride: 103 mmol/L (ref 98–110)
Creat: 0.98 mg/dL (ref 0.50–1.03)
Globulin: 2.5 g/dL (calc) (ref 1.9–3.7)
Glucose, Bld: 78 mg/dL (ref 65–99)
Potassium: 5.2 mmol/L (ref 3.5–5.3)
Sodium: 141 mmol/L (ref 135–146)
Total Bilirubin: 0.3 mg/dL (ref 0.2–1.2)
Total Protein: 6.9 g/dL (ref 6.1–8.1)
eGFR: 70 mL/min/{1.73_m2} (ref >=60)

## 2022-02-18 LAB — IRON,TIBC AND FERRITIN PANEL
%SAT: 28 % (calc) (ref 16–45)
Ferritin: 61 ng/mL (ref 16–232)
Iron: 91 ug/dL (ref 45–160)
TIBC: 320 mcg/dL (calc) (ref 250–450)

## 2022-02-18 LAB — TSH: TSH: 1.08 mIU/L

## 2022-02-18 LAB — VITAMIN D 25 HYDROXY (VIT D DEFICIENCY, FRACTURES): Vit D, 25-Hydroxy: 24 ng/mL — ABNORMAL LOW (ref 30–100)

## 2022-02-18 LAB — B12 AND FOLATE PANEL
Folate: 13 ng/mL
Vitamin B-12: 467 pg/mL (ref 200–1100)

## 2022-02-18 NOTE — Progress Notes (Signed)
Vitamin D not to goal. Start 2000 units daily.  ?Kidney, liver, glucose look good.  ?Normal b12.  ?Thyroid looks great.  ?Iron looks good.  ?

## 2022-02-22 ENCOUNTER — Encounter: Payer: Self-pay | Admitting: Physician Assistant

## 2022-02-22 DIAGNOSIS — M5134 Other intervertebral disc degeneration, thoracic region: Secondary | ICD-10-CM | POA: Insufficient documentation

## 2022-02-22 NOTE — Progress Notes (Signed)
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 No acute changes. Treatment plan stays the same.

## 2022-02-22 NOTE — Progress Notes (Signed)
No acute or significant findings on lumbar xray. Alignment looks good. Disc space looks good.

## 2022-02-23 DIAGNOSIS — M7062 Trochanteric bursitis, left hip: Secondary | ICD-10-CM | POA: Insufficient documentation

## 2022-02-23 DIAGNOSIS — M549 Dorsalgia, unspecified: Secondary | ICD-10-CM | POA: Insufficient documentation

## 2022-02-23 DIAGNOSIS — R5383 Other fatigue: Secondary | ICD-10-CM | POA: Insufficient documentation

## 2022-02-23 DIAGNOSIS — R079 Chest pain, unspecified: Secondary | ICD-10-CM | POA: Insufficient documentation

## 2022-02-23 DIAGNOSIS — G8929 Other chronic pain: Secondary | ICD-10-CM | POA: Insufficient documentation

## 2022-03-09 DIAGNOSIS — I10 Essential (primary) hypertension: Secondary | ICD-10-CM | POA: Insufficient documentation

## 2022-03-10 ENCOUNTER — Ambulatory Visit: Payer: PRIVATE HEALTH INSURANCE | Admitting: Physician Assistant

## 2022-04-07 ENCOUNTER — Encounter: Payer: Self-pay | Admitting: Gastroenterology

## 2022-05-12 ENCOUNTER — Encounter: Payer: Self-pay | Admitting: Neurology

## 2022-11-11 ENCOUNTER — Ambulatory Visit: Payer: PRIVATE HEALTH INSURANCE | Admitting: Sports Medicine

## 2023-01-11 ENCOUNTER — Telehealth: Payer: Self-pay | Admitting: Physician Assistant

## 2023-01-11 DIAGNOSIS — Z1231 Encounter for screening mammogram for malignant neoplasm of breast: Secondary | ICD-10-CM

## 2023-01-11 DIAGNOSIS — Z1211 Encounter for screening for malignant neoplasm of colon: Secondary | ICD-10-CM

## 2023-01-11 NOTE — Telephone Encounter (Signed)
Danielle Marsh states she had a PAP with Robyne Peers a year ago. I have faxed a request for recent PAP. I also ordered GI referral for a colonoscopy per patient request.

## 2023-01-28 ENCOUNTER — Encounter: Payer: Self-pay | Admitting: Physician Assistant

## 2023-02-23 ENCOUNTER — Ambulatory Visit: Payer: PRIVATE HEALTH INSURANCE

## 2023-12-06 ENCOUNTER — Ambulatory Visit: Payer: Self-pay | Admitting: Physician Assistant

## 2023-12-06 NOTE — Telephone Encounter (Signed)
 Red Word that prompted transfer to Nurse Triage: Patient states she is having a hard time breathing an taking deep breaths. It feels like something is on her chest. Patient states it been going on for 3 days now and some back pain as well.     Chief Complaint: Pain with deep breathing, hurts across back at shoulders and chest. Comes and goes.Has dizziness at times. Symptoms: Above Frequency: 3 days Pertinent Negatives: Patient denies chest pain Disposition: [] ED /[] Urgent Care (no appt availability in office) / [x] Appointment(In office/virtual)/ []  [facility]/ [] Home Care/ [] Refused Recommended Disposition /[] [facility]/ []  Follow-up with PCP Additional Notes: Will go to ED for worsening of symptoms.  Reason for Disposition  [1] MILD difficulty breathing (e.g., minimal/no SOB at rest, SOB with walking, pulse <100) AND [2] NEW-onset or WORSE than normal  Answer Assessment - Initial Assessment Questions 1. RESPIRATORY STATUS: "Describe your breathing?" (e.g., wheezing, shortness of breath, unable to speak, severe coughing)      SOB, Pain between shoulders, lower 2. ONSET: "When did this breathing problem begin?"      3 days ago 3. PATTERN "Does the difficult breathing come and go, or has it been constant since it started?"      Comes and goes 4. SEVERITY: "How bad is your breathing?" (e.g., mild, moderate, severe)    - MILD: No SOB at rest, mild SOB with walking, speaks normally in sentences, can lie down, no retractions, pulse < 100.    - MODERATE: SOB at rest, SOB with minimal exertion and prefers to sit, cannot lie down flat, speaks in phrases, mild retractions, audible wheezing, pulse 100-120.    - SEVERE: Very SOB at rest, speaks in single words, struggling to breathe, sitting hunched forward, retractions, pulse > 120      Mild-moderate 5. RECURRENT SYMPTOM: "Have you had difficulty breathing before?" If Yes, ask: "When was the last time?" and "What happened that  time?"      No 6. CARDIAC HISTORY: "Do you have any history of heart disease?" (e.g., heart attack, angina, bypass surgery, angioplasty)      No 7. LUNG HISTORY: "Do you have any history of lung disease?"  (e.g., pulmonary embolus, asthma, emphysema)     Asthma 8. CAUSE: "What do you think is causing the breathing problem?"      Unsure 9. OTHER SYMPTOMS: "Do you have any other symptoms? (e.g., dizziness, runny nose, cough, chest pain, fever)     Dizzy, [place] 10. O2 SATURATION MONITOR:  "Do you use an oxygen saturation monitor (pulse oximeter) at home?" If Yes, ask: "What is your reading (oxygen level) today?" "What is your usual oxygen saturation reading?" (e.g., 95%)       No 11. PREGNANCY: "Is there any chance you are pregnant?" "When was your last menstrual period?"       No 12. TRAVEL: "Have you traveled out of the country in the last month?" (e.g., travel history, exposures)       No  Protocols used: Breathing Difficulty-A-AH

## 2023-12-07 ENCOUNTER — Ambulatory Visit: Payer: PRIVATE HEALTH INSURANCE | Admitting: Medical-Surgical

## 2023-12-08 NOTE — Telephone Encounter (Signed)
 Patient seen 12/07/23  by Christen Butter, NP in office .

## 2024-01-09 ENCOUNTER — Other Ambulatory Visit: Payer: Self-pay | Admitting: Physician Assistant

## 2024-01-09 DIAGNOSIS — Z1231 Encounter for screening mammogram for malignant neoplasm of breast: Secondary | ICD-10-CM

## 2024-02-19 IMAGING — DX DG THORACIC SPINE 3V
3 series · 3 of 3 positions shown · non-contrast
Comparison: None Available.

CLINICAL DATA: Back pain for 3 weeks after fall down stairs.

EXAM:
THORACIC SPINE - 3 VIEWS

[t-spine ap]
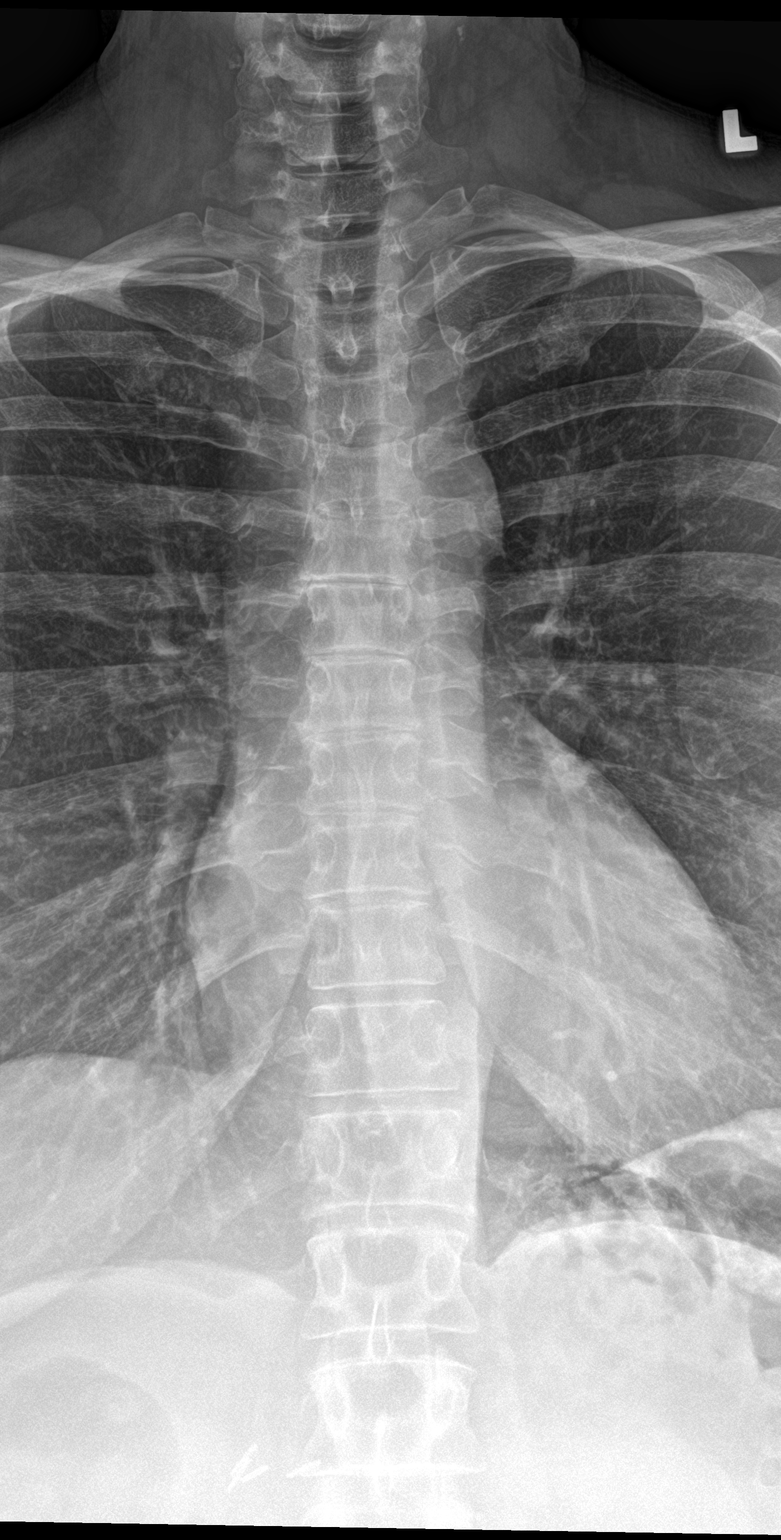

[t-spine lat]
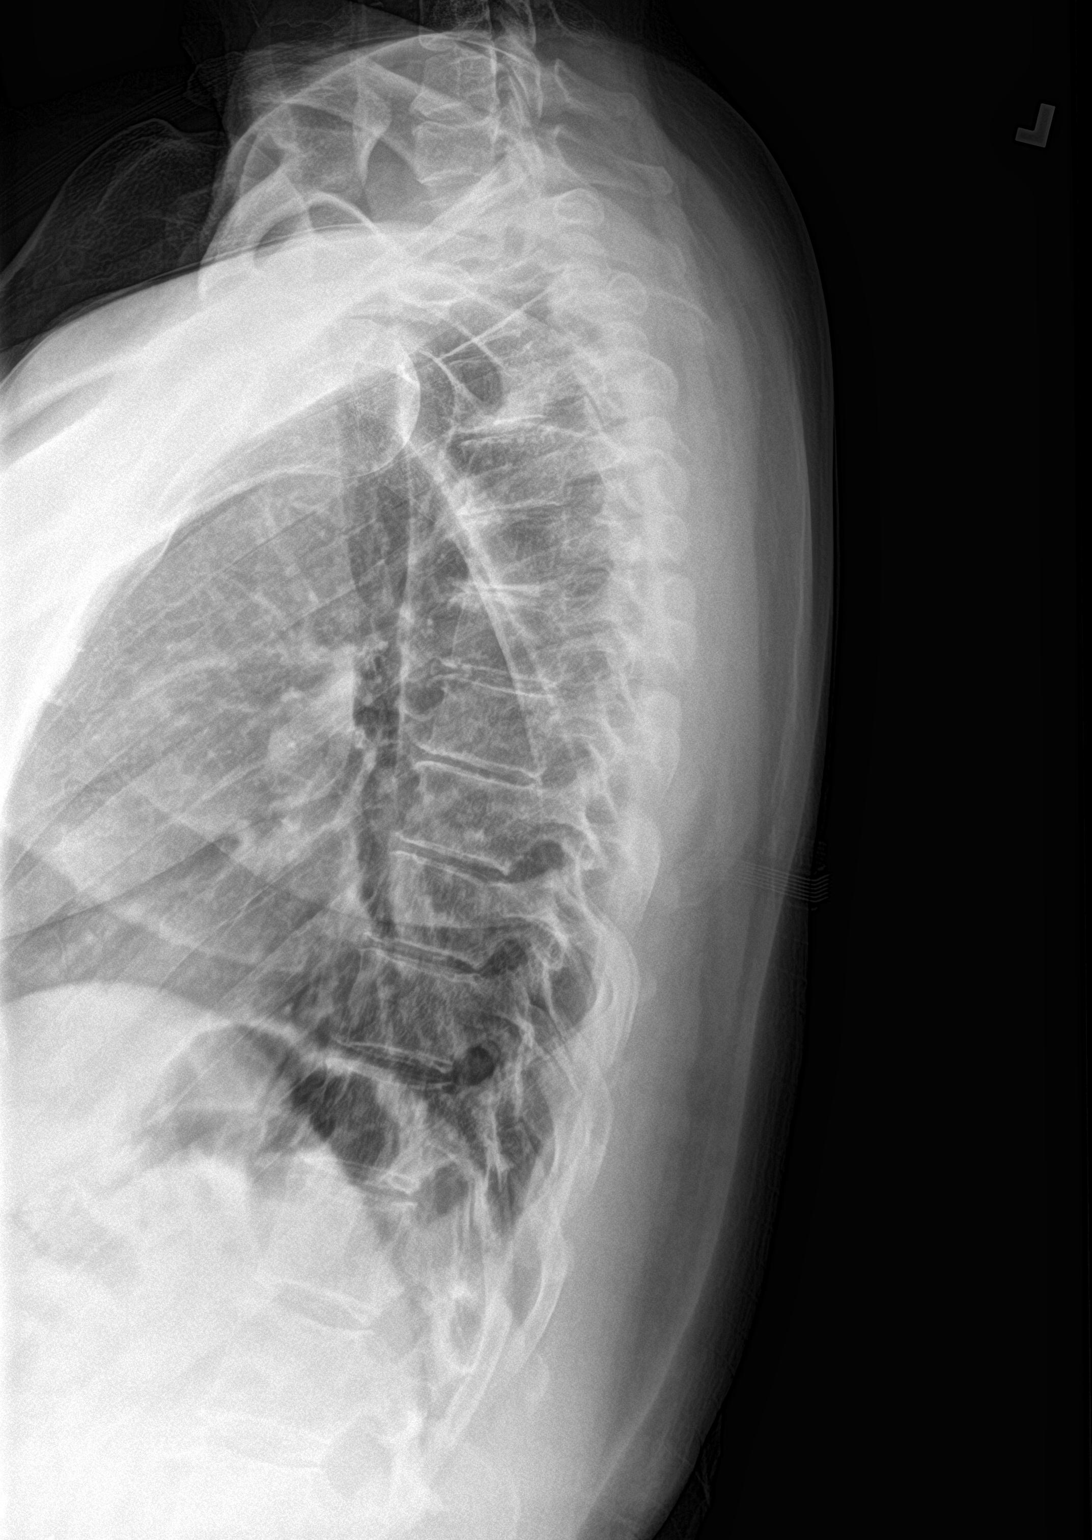

[t-spine swimmers]
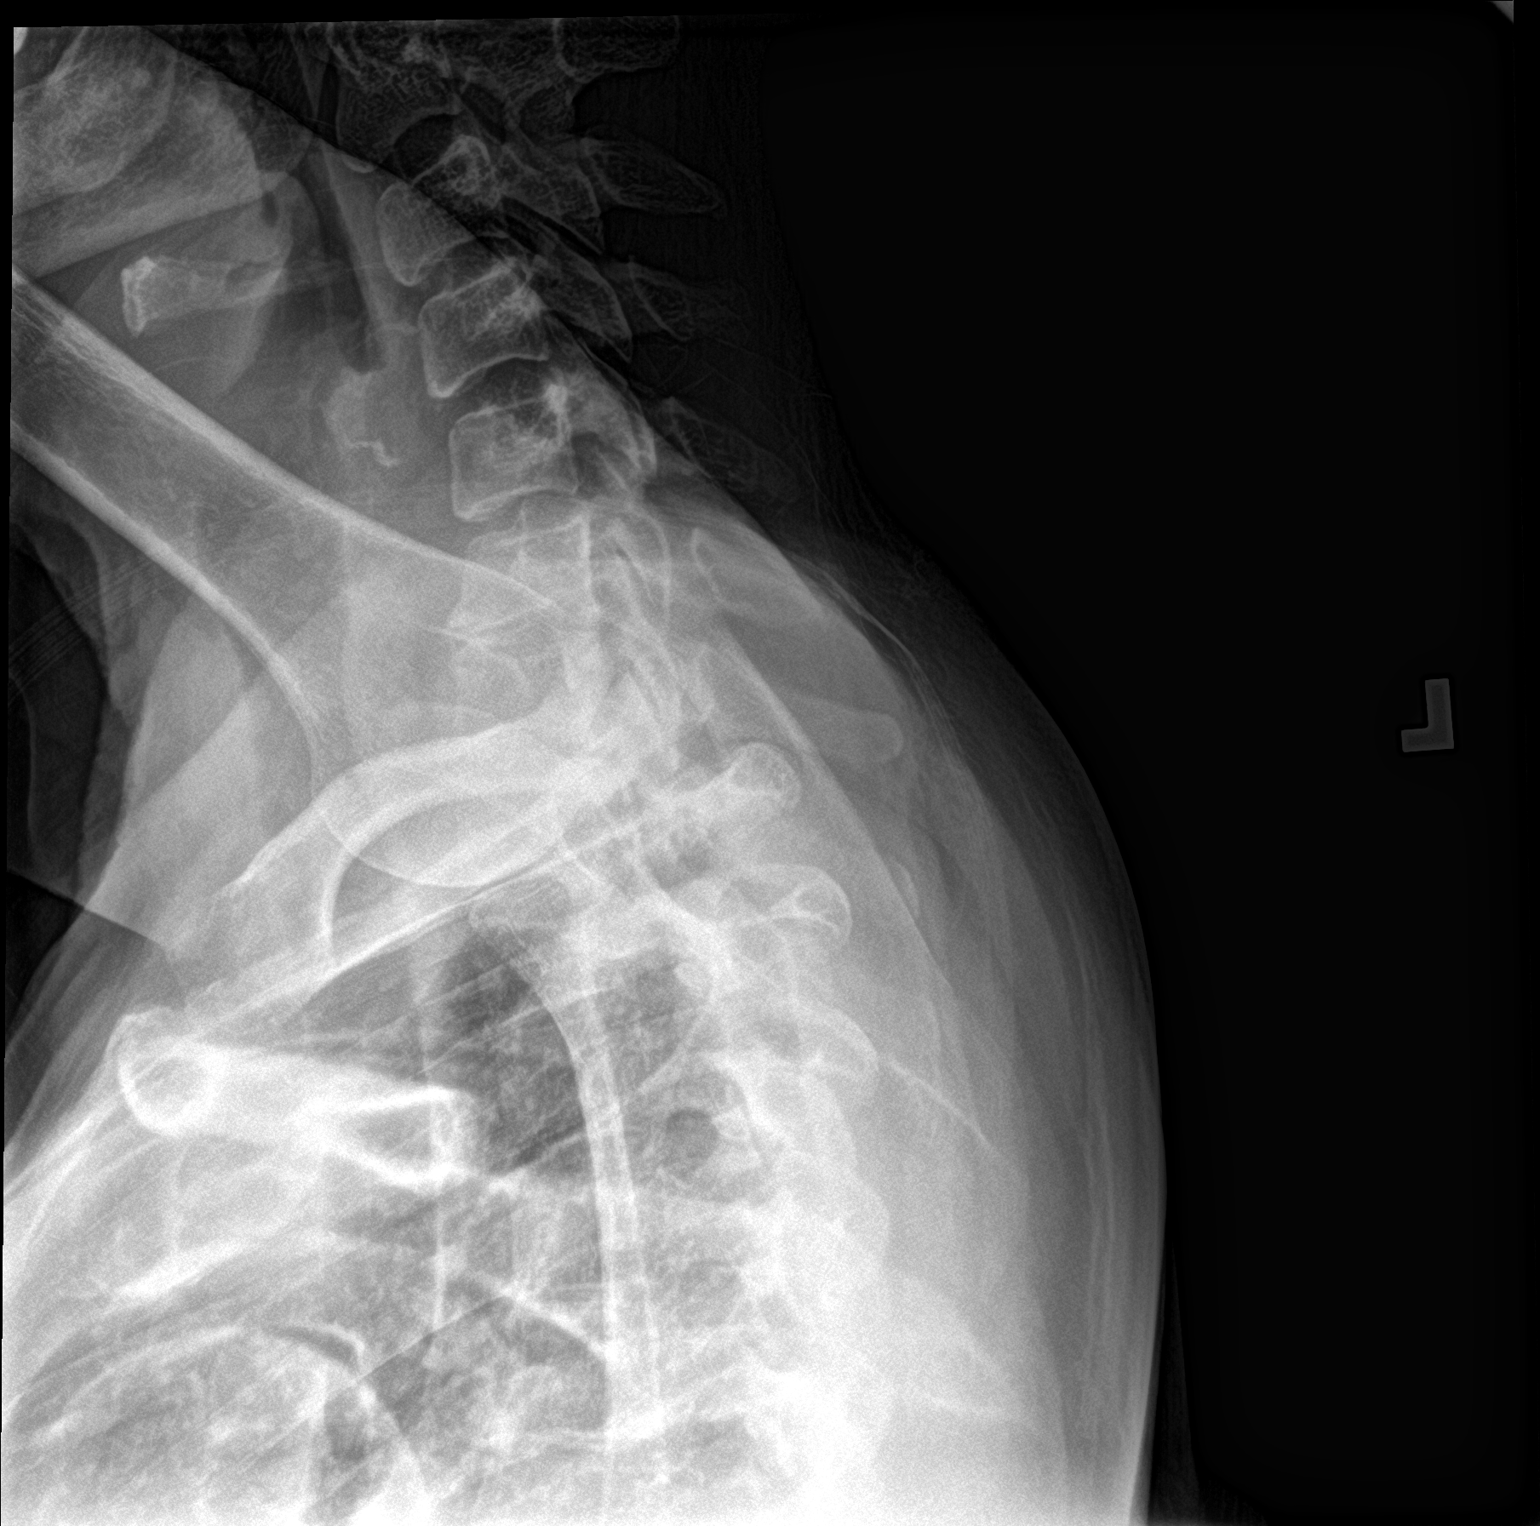

[3 of 3 positions shown; findings below may reference images not displayed]

FINDINGS: There is no evidence of thoracic spine fracture. Alignment is
normal. Mild degenerative changes are noted in the upper and
midthoracic spine.
IMPRESSION: Mild multilevel degenerative changes.  No acute abnormality seen.

## 2024-06-12 ENCOUNTER — Encounter: Payer: Self-pay | Admitting: Sports Medicine

## 2024-08-03 ENCOUNTER — Ambulatory Visit: Payer: Self-pay

## 2024-08-03 NOTE — Telephone Encounter (Signed)
 Patient states that over the last month she has been having shortness of breath, chest pain, fatigue, body pain along with pain when taking a deep breath in.  Patient states that her chest will burn when she takes a deep breath in. Endorses chest pain that occurs at anytime. Patient is recommended to the ED for symptoms. Patient verbalized understanding and all questions answered.   FYI Only or Action Required?: FYI only for provider.  Patient was last seen in primary care on 02/17/2022 by Antoniette Vermell CROME, PA-C.  Called Nurse Triage reporting Shortness of Breath.  Symptoms began about a month ago.  Interventions attempted: Rest, hydration, or home remedies.  Symptoms are: gradually worsening.  Triage Disposition: Go to ED Now (or PCP Triage)  Patient/caregiver understands and will follow disposition?: Yes  Copied from CRM [id number]. Topic: Clinical - Red Word Triage >> Aug 03, 2024  2:14 PM Ivette P wrote: Red Word that prompted transfer to Nurse Triage: having issues lungs, feeling out of breath when anything is on top of her. Always tired. And body is aching .    When in line to pickup grandson will close eyes and think she is jsut closing eyes pt will fall asleep . Reason for Disposition  Taking a deep breath makes pain worse  Answer Assessment - Initial Assessment Questions 1. RESPIRATORY STATUS: Describe your breathing? (e.g., wheezing, shortness of breath, unable to speak, severe coughing)      Shortness of breath 2. ONSET: When did this breathing problem begin?      Started a month ago 3. PATTERN Does the difficult breathing come and go, or has it been constant since it started?      With movement and patient states SOB when her granddaughter is laying on her chest 4. SEVERITY: How bad is your breathing? (e.g., mild, moderate, severe)      No issues currently 5. RECURRENT SYMPTOM: Have you had difficulty breathing before? If Yes, ask: When was the last time? and  What happened that time?      yes 6. CARDIAC HISTORY: Do you have any history of heart disease? (e.g., heart attack, angina, bypass surgery, angioplasty)      no 7. LUNG HISTORY: Do you have any history of lung disease?  (e.g., pulmonary embolus, asthma, emphysema)     yes 8. CAUSE: What do you think is causing the breathing problem?      unsure 9. OTHER SYMPTOMS: Do you have any other symptoms? (e.g., chest pain, cough, dizziness, fever, runny nose)     Chest pain 10. O2 SATURATION MONITOR:  Do you use an oxygen saturation monitor (pulse oximeter) at home? If Yes, ask: What is your reading (oxygen level) today? What is your usual oxygen saturation reading? (e.g., 95%)       no 12. TRAVEL: Have you traveled out of the country in the last month? (e.g., travel history, exposures)       no  Answer Assessment - Initial Assessment Questions 1. LOCATION: Where does it hurt?       Mid chest and goes to the left side 2. RADIATION: Does the pain go anywhere else? (e.g., into neck, jaw, arms, back)     Into the back and jaw pain 3. ONSET: When did the chest pain begin? (Minutes, hours or days)      Started a month 4. PATTERN: Does the pain come and go, or has it been constant since it started?  Does it get worse with exertion?  Comes and goes 5. DURATION: How long does it last (e.g., seconds, minutes, hours)     Goes for different periods of time 6. SEVERITY: How bad is the pain?  (e.g., Scale 1-10; mild, moderate, or severe)     no 7. CARDIAC RISK FACTORS: Do you have any history of heart problems or risk factors for heart disease? (e.g., angina, prior heart attack; diabetes, high blood pressure, high cholesterol, smoker, or strong family history of heart disease)     no 8. PULMONARY RISK FACTORS: Do you have any history of lung disease?  (e.g., blood clots in lung, asthma, emphysema, birth control pills)     yes 9. CAUSE: What do you think is causing  the chest pain?     unsure 10. OTHER SYMPTOMS: Do you have any other symptoms? (e.g., dizziness, nausea, vomiting, sweating, fever, difficulty breathing, cough)       Shortness of breath, fatigue  Protocols used: Breathing Difficulty-A-AH, Chest Pain-A-AH

## 2024-08-07 NOTE — Telephone Encounter (Signed)
 Attempted call to patient to see if she went to ER as recommended by  [facility] triage nurse. . Left a voice mail message requesting a return call.

## 2024-08-08 NOTE — Telephone Encounter (Signed)
 Second attempt to contact the patient for a follow up on her symptoms. No answer, left a vm msg requesting a call back to the clinic. Direct call back info provided.

## 2024-08-09 NOTE — Telephone Encounter (Signed)
 Again attempted call to patient. Left a voice mail message requesting a return call.

## 2024-08-16 NOTE — Telephone Encounter (Signed)
 Again attempted call to patient. Again left a voice mail message requesting a return call.

## 2024-08-24 ENCOUNTER — Telehealth: Payer: Self-pay | Admitting: Physician Assistant

## 2024-08-24 NOTE — Telephone Encounter (Signed)
 Copied from CRM #[id number]. Topic: Clinical - Medical Advice >> Aug 24, 2024  1:41 PM Anairis L wrote: Reason for CRM: Patient is calling to let Bonny Jon DEL, CMA know she is going to the ER, as soon as husband gets home from work.

## 2024-08-24 NOTE — Telephone Encounter (Signed)
 Copied from CRM #[id number]. Topic: Clinical - Refused Triage >> Aug 24, 2024 11:20 AM Ivette P wrote: Patient/caller voiced complaints of been going on for like 3-4 weeks.   Lungs having trouble breathing - from walking or walking up steps.   Headaches started 2 weeks ago.   Does not want to go to ER. Pt would like to be scheduled. . Declined transfer to triage.

## 2024-08-24 NOTE — Telephone Encounter (Signed)
 I tried to call patient. Voicemail is full.

## 2024-08-24 NOTE — Telephone Encounter (Signed)
 I called and spoke with patient. She states she is also having headaches with jaw pain. I advised her to go to the ED sooner than later.

## 2024-08-24 NOTE — Telephone Encounter (Signed)
I will see her at 3: 20 today

## 2024-08-27 ENCOUNTER — Ambulatory Visit: Payer: PRIVATE HEALTH INSURANCE | Admitting: Physician Assistant
